# Patient Record
Sex: Female | Born: 1985 | Race: White | Hispanic: No | Marital: Married | Smoking: Former smoker
Health system: Southern US, Community
[De-identification: ages and names within clinical notes are randomized; demographics above are authoritative.]

## PROBLEM LIST (undated history)

## (undated) ENCOUNTER — Inpatient Hospital Stay: Payer: Self-pay

## (undated) DIAGNOSIS — Z87898 Personal history of other specified conditions: Secondary | ICD-10-CM

## (undated) DIAGNOSIS — K219 Gastro-esophageal reflux disease without esophagitis: Secondary | ICD-10-CM

## (undated) DIAGNOSIS — F32A Depression, unspecified: Secondary | ICD-10-CM

## (undated) DIAGNOSIS — I10 Essential (primary) hypertension: Secondary | ICD-10-CM

## (undated) DIAGNOSIS — O09299 Supervision of pregnancy with other poor reproductive or obstetric history, unspecified trimester: Secondary | ICD-10-CM

## (undated) DIAGNOSIS — R87619 Unspecified abnormal cytological findings in specimens from cervix uteri: Secondary | ICD-10-CM

## (undated) DIAGNOSIS — F419 Anxiety disorder, unspecified: Secondary | ICD-10-CM

## (undated) DIAGNOSIS — Z8751 Personal history of pre-term labor: Secondary | ICD-10-CM

## (undated) HISTORY — PX: COLPOSCOPY: SHX161

## (undated) HISTORY — DX: Depression, unspecified: F32.A

## (undated) HISTORY — DX: Gastro-esophageal reflux disease without esophagitis: K21.9

## (undated) HISTORY — DX: Anxiety disorder, unspecified: F41.9

## (undated) HISTORY — DX: Unspecified abnormal cytological findings in specimens from cervix uteri: R87.619

## (undated) HISTORY — PX: WISDOM TOOTH EXTRACTION: SHX21

---
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
## 2007-08-22 ENCOUNTER — Emergency Department: Payer: Self-pay | Admitting: Emergency Medicine

## 2007-12-05 ENCOUNTER — Emergency Department: Payer: Self-pay | Admitting: Emergency Medicine

## 2008-05-18 ENCOUNTER — Encounter: Payer: Self-pay | Admitting: Obstetrics and Gynecology

## 2008-06-21 ENCOUNTER — Observation Stay: Payer: Self-pay | Admitting: Obstetrics and Gynecology

## 2008-07-23 ENCOUNTER — Encounter: Payer: Self-pay | Admitting: Obstetrics and Gynecology

## 2008-07-30 ENCOUNTER — Encounter: Payer: Self-pay | Admitting: Obstetrics and Gynecology

## 2008-08-06 ENCOUNTER — Encounter: Payer: Self-pay | Admitting: Obstetrics and Gynecology

## 2008-08-13 ENCOUNTER — Encounter: Payer: Self-pay | Admitting: Maternal and Fetal Medicine

## 2008-08-13 ENCOUNTER — Emergency Department: Payer: Self-pay | Admitting: Emergency Medicine

## 2008-08-18 ENCOUNTER — Observation Stay: Payer: Self-pay | Admitting: Obstetrics and Gynecology

## 2008-08-19 DIAGNOSIS — O1490 Unspecified pre-eclampsia, unspecified trimester: Secondary | ICD-10-CM

## 2008-08-19 DIAGNOSIS — O1420 HELLP syndrome (HELLP), unspecified trimester: Secondary | ICD-10-CM

## 2008-08-27 ENCOUNTER — Encounter: Payer: Self-pay | Admitting: Obstetrics and Gynecology

## 2009-03-15 ENCOUNTER — Ambulatory Visit: Payer: Self-pay | Admitting: Internal Medicine

## 2010-07-25 ENCOUNTER — Emergency Department: Payer: Self-pay | Admitting: Unknown Physician Specialty

## 2010-10-30 ENCOUNTER — Emergency Department (HOSPITAL_COMMUNITY)
Admission: EM | Admit: 2010-10-30 | Discharge: 2010-10-30 | Disposition: A | Discharge status: Home / Self Care | Payer: Self-pay | Attending: Emergency Medicine | Admitting: Emergency Medicine

## 2010-10-30 ENCOUNTER — Encounter: Payer: Self-pay | Admitting: *Deleted

## 2010-10-30 DIAGNOSIS — N72 Inflammatory disease of cervix uteri: Secondary | ICD-10-CM | POA: Insufficient documentation

## 2010-10-30 DIAGNOSIS — Z87891 Personal history of nicotine dependence: Secondary | ICD-10-CM | POA: Insufficient documentation

## 2010-10-30 DIAGNOSIS — L0291 Cutaneous abscess, unspecified: Secondary | ICD-10-CM

## 2010-10-30 DIAGNOSIS — B9689 Other specified bacterial agents as the cause of diseases classified elsewhere: Secondary | ICD-10-CM | POA: Insufficient documentation

## 2010-10-30 DIAGNOSIS — N760 Acute vaginitis: Secondary | ICD-10-CM | POA: Insufficient documentation

## 2010-10-30 DIAGNOSIS — A499 Bacterial infection, unspecified: Secondary | ICD-10-CM | POA: Insufficient documentation

## 2010-10-30 LAB — POCT PREGNANCY, URINE: Preg Test, Ur: NEGATIVE

## 2010-10-30 LAB — URINALYSIS, ROUTINE W REFLEX MICROSCOPIC
Bilirubin Urine: NEGATIVE
Glucose, UA: NEGATIVE mg/dL
Hgb urine dipstick: NEGATIVE
Ketones, ur: NEGATIVE mg/dL
Leukocytes, UA: NEGATIVE
Nitrite: NEGATIVE
Protein, ur: NEGATIVE mg/dL
Specific Gravity, Urine: 1.01 (ref 1.005–1.030)
Urobilinogen, UA: 0.2 mg/dL (ref 0.0–1.0)
pH: 7.5 (ref 5.0–8.0)

## 2010-10-30 LAB — WET PREP, GENITAL: Yeast Wet Prep HPF POC: NONE SEEN

## 2010-10-30 MED ORDER — METRONIDAZOLE 500 MG PO TABS: 500.0000 mg | ORAL_TABLET | Freq: Two times a day (BID) | ORAL | Status: AC

## 2010-10-30 MED ORDER — IBUPROFEN 800 MG PO TABS
800.0000 mg | ORAL_TABLET | Freq: Once | ORAL | Status: AC
  Administered 2010-10-30: 800 mg via ORAL
  Filled 2010-10-30: qty 1

## 2010-10-30 MED ORDER — HYDROCODONE-ACETAMINOPHEN 5-325 MG PO TABS: ORAL_TABLET | ORAL | Status: AC

## 2010-10-30 MED ORDER — ACYCLOVIR 200 MG PO CAPS: ORAL_CAPSULE | ORAL | Status: DC

## 2010-10-30 NOTE — ED Provider Notes (Signed)
History     CSN: [id number] Arrival date & time: 10/30/2010  4:38 PM  Chief Complaint  Patient presents with  . Abscess   HPI Comments: Patient c/o small red "bump" to her upper vaginal area for one week.  States that she has had similar lesions in the past that resolved spontaneously.  Also reports vaginal discharge that thin and clear for several days. Denies any sexual activity for several months.  Also denies fever, urinary sx's, abd pain or vomiting  Patient is a 25 y.o. female presenting with abscess. The history is provided by the patient.  Abscess  This is a recurrent problem. The current episode started more than one week ago. The onset was gradual. The problem occurs continuously. The problem has been unchanged. The abscess is present on the genitalia. The problem is mild. The abscess is characterized by painfulness and redness. It is unknown what she was exposed to. Pertinent negatives include not sleeping less, not drinking less, no fever, no diarrhea, no vomiting, no sore throat and no cough. Associated symptoms comments: Vaginal discharge. There were no sick contacts. She has received no recent medical care.    History reviewed. No pertinent past medical history.  Past Surgical History  Procedure Date  . Cesarean section     History reviewed. No pertinent family history.  History  Substance Use Topics  . Smoking status: Former Smoker    Types: Cigarettes  . Smokeless tobacco: Not on file  . Alcohol Use: Yes     occasionally    OB History    Grav Para Term Preterm Abortions TAB SAB Ect Mult Living                  Review of Systems  Constitutional: Negative for fever and chills.  HENT: Negative for sore throat.   Respiratory: Negative for cough.   Gastrointestinal: Negative for vomiting and diarrhea.  Genitourinary: Positive for vaginal discharge and genital sores. Negative for dysuria, frequency, hematuria, vaginal bleeding, difficulty urinating, vaginal pain,  menstrual problem and pelvic pain.  Musculoskeletal: Negative.   Neurological: Negative for weakness and numbness.  Hematological: Does not bruise/bleed easily.  All other systems reviewed and are negative.    Physical Exam  BP 127/65  Pulse 91  Temp(Src) 98.7 F (37.1 C) (Oral)  Resp 16  Ht 5\' 4"  (1.626 m)  Wt 116 lb (52.617 kg)  BMI 19.91 kg/m2  SpO2 100%  Physical Exam  Nursing note and vitals reviewed. Constitutional: She is oriented to person, place, and time. She appears well-developed and well-nourished. No distress.  HENT:  Head: Normocephalic and atraumatic.  Mouth/Throat: Oropharynx is clear and moist.  Neck: Normal range of motion. Neck supple. No thyromegaly present.  Cardiovascular: Normal rate, regular rhythm and normal heart sounds.   Pulmonary/Chest: Effort normal and breath sounds normal.  Abdominal: Soft. She exhibits no distension and no mass. There is no tenderness. There is no rebound and no guarding.  Genitourinary: Uterus normal. There is no tenderness on the right labia. There is no tenderness on the left labia. Cervix exhibits discharge. Cervix exhibits no motion tenderness and no friability. Right adnexum displays no mass and no tenderness. Left adnexum displays no mass and no tenderness. No erythema or bleeding around the vagina. Vaginal discharge found.       Small ulceration to the clitoris, no other rashes, swelling or erythema seen on exam.  Musculoskeletal: She exhibits no edema and no tenderness.  Lymphadenopathy:    She  has no cervical adenopathy.  Neurological: She is alert and oriented to person, place, and time. No cranial nerve deficit. She exhibits normal muscle tone. Coordination normal.  Skin: Skin is warm and dry.    ED Course  Procedures  MDM  Bacterial vaginosis   7:17 PM patient is alert, NAD.  Vitals are stable, non-toxic appearing.  GC, chlaymdia and RPR culture pending.  Patient has small erythematous area to the clitoris  with mild edema.  No drainage.  I will treat with flagyl for the BV and zovirax for ulceration which may be herpatic although single ulcer without grouped vesicles..  Pt agrees to return here if sx's worsen.     Patient / Family / Caregiver understand and agree with initial ED impression and plan with expectations set for ED visit.    The patient appears reasonably screened and/or stabilized for discharge and I doubt any other medical condition or other [facility] requiring further screening, evaluation, or treatment in the ED at this time prior to discharge.   Results for orders placed during the hospital encounter of 10/30/10  WET PREP, GENITAL      Component Value Range   Yeast, Wet Prep NONE SEEN  NONE SEEN    Trich, Wet Prep NONE SEEN  NONE SEEN    Clue Cells, Wet Prep FEW (*) NONE SEEN    WBC, Wet Prep HPF POC FEW (*) NONE SEEN   URINALYSIS, ROUTINE W REFLEX MICROSCOPIC      Component Value Range   Color, Urine YELLOW  YELLOW    Appearance CLEAR  CLEAR    Specific Gravity, Urine 1.010  1.005 - 1.030    pH 7.5  5.0 - 8.0    Glucose, UA NEGATIVE  NEGATIVE (mg/dL)   Hgb urine dipstick NEGATIVE  NEGATIVE    Bilirubin Urine NEGATIVE  NEGATIVE    Ketones, ur NEGATIVE  NEGATIVE (mg/dL)   Protein, ur NEGATIVE  NEGATIVE (mg/dL)   Urobilinogen, UA 0.2  0.0 - 1.0 (mg/dL)   Nitrite NEGATIVE  NEGATIVE    Leukocytes, UA NEGATIVE  NEGATIVE   POCT PREGNANCY, URINE      Component Value Range   Preg Test, Ur NEGATIVE           Tammy L. Mayer, [place] 11/04/10 1459   Medical screening examination/treatment/procedure(s) were performed by non-physician practitioner and as supervising physician I was immediately available for consultation/collaboration.  Nicholes Stairs, MD 11/16/10 [phone]

## 2010-10-30 NOTE — ED Notes (Signed)
Pt states that she has had a knot at the top of her vagina x 1 week. Pt also c/o vaginal discharge today. States that it is clear.

## 2010-11-01 LAB — GC/CHLAMYDIA PROBE AMP, GENITAL: GC Probe Amp, Genital: NEGATIVE

## 2013-01-12 ENCOUNTER — Emergency Department (HOSPITAL_COMMUNITY): Payer: Self-pay

## 2013-01-12 ENCOUNTER — Encounter (HOSPITAL_COMMUNITY): Payer: Self-pay | Admitting: Emergency Medicine

## 2013-01-12 ENCOUNTER — Emergency Department (HOSPITAL_COMMUNITY): Payer: No Typology Code available for payment source

## 2013-01-12 ENCOUNTER — Emergency Department (HOSPITAL_COMMUNITY)
Admission: EM | Admit: 2013-01-12 | Discharge: 2013-01-12 | Disposition: A | Discharge status: Home / Self Care | Payer: Self-pay | Attending: Emergency Medicine | Admitting: Emergency Medicine

## 2013-01-12 DIAGNOSIS — Z79899 Other long term (current) drug therapy: Secondary | ICD-10-CM | POA: Insufficient documentation

## 2013-01-12 DIAGNOSIS — Z87891 Personal history of nicotine dependence: Secondary | ICD-10-CM | POA: Insufficient documentation

## 2013-01-12 DIAGNOSIS — S161XXA Strain of muscle, fascia and tendon at neck level, initial encounter: Secondary | ICD-10-CM

## 2013-01-12 DIAGNOSIS — S0003XA Contusion of scalp, initial encounter: Secondary | ICD-10-CM | POA: Insufficient documentation

## 2013-01-12 DIAGNOSIS — S025XXA Fracture of tooth (traumatic), initial encounter for closed fracture: Secondary | ICD-10-CM | POA: Insufficient documentation

## 2013-01-12 DIAGNOSIS — IMO0002 Reserved for concepts with insufficient information to code with codable children: Secondary | ICD-10-CM | POA: Insufficient documentation

## 2013-01-12 DIAGNOSIS — Y9389 Activity, other specified: Secondary | ICD-10-CM | POA: Insufficient documentation

## 2013-01-12 DIAGNOSIS — S0993XA Unspecified injury of face, initial encounter: Secondary | ICD-10-CM

## 2013-01-12 DIAGNOSIS — T07XXXA Unspecified multiple injuries, initial encounter: Secondary | ICD-10-CM | POA: Insufficient documentation

## 2013-01-12 DIAGNOSIS — S139XXA Sprain of joints and ligaments of unspecified parts of neck, initial encounter: Secondary | ICD-10-CM | POA: Insufficient documentation

## 2013-01-12 DIAGNOSIS — S79919A Unspecified injury of unspecified hip, initial encounter: Secondary | ICD-10-CM | POA: Insufficient documentation

## 2013-01-12 DIAGNOSIS — Y92410 Unspecified street and highway as the place of occurrence of the external cause: Secondary | ICD-10-CM | POA: Insufficient documentation

## 2013-01-12 MED ORDER — CYCLOBENZAPRINE HCL 5 MG PO TABS: 5.0000 mg | ORAL_TABLET | Freq: Three times a day (TID) | ORAL | Status: DC | PRN

## 2013-01-12 MED ORDER — OXYCODONE-ACETAMINOPHEN 5-325 MG PO TABS: 1.0000 | ORAL_TABLET | ORAL | Status: DC | PRN

## 2013-01-12 MED ORDER — OXYCODONE-ACETAMINOPHEN 5-325 MG PO TABS
1.0000 | ORAL_TABLET | Freq: Once | ORAL | Status: AC
  Administered 2013-01-12: 1 via ORAL
  Filled 2013-01-12: qty 1

## 2013-01-12 MED ORDER — NAPROXEN 500 MG PO TABS: 500.0000 mg | ORAL_TABLET | Freq: Two times a day (BID) | ORAL | Status: DC

## 2013-01-12 NOTE — ED Notes (Signed)
Hope NP at bedside  
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
## 2013-01-12 NOTE — ED Notes (Signed)
Hope NP and Dr. Bebe Shaggy at bedside,
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
## 2013-01-13 NOTE — ED Provider Notes (Signed)
Medical screening examination/treatment/procedure(s) were conducted as a shared visit with non-physician practitioner(s) and myself.  I personally evaluated the patient during the encounter.  EKG Interpretation   None        Pt well appearing, abdomen soft on my exam, she is feeling improved, and she is stable for d/c home  Joya Gaskins, MD 01/13/13 [phone]

## 2013-05-17 ENCOUNTER — Emergency Department (HOSPITAL_COMMUNITY)
Admission: EM | Admit: 2013-05-17 | Discharge: 2013-05-17 | Disposition: A | Discharge status: Home / Self Care | Payer: Self-pay | Attending: Emergency Medicine | Admitting: Emergency Medicine

## 2013-05-17 ENCOUNTER — Encounter (HOSPITAL_COMMUNITY): Payer: Self-pay | Admitting: Emergency Medicine

## 2013-05-17 DIAGNOSIS — Z87891 Personal history of nicotine dependence: Secondary | ICD-10-CM | POA: Insufficient documentation

## 2013-05-17 DIAGNOSIS — R059 Cough, unspecified: Secondary | ICD-10-CM | POA: Insufficient documentation

## 2013-05-17 DIAGNOSIS — R109 Unspecified abdominal pain: Secondary | ICD-10-CM | POA: Insufficient documentation

## 2013-05-17 DIAGNOSIS — R1110 Vomiting, unspecified: Secondary | ICD-10-CM

## 2013-05-17 DIAGNOSIS — R112 Nausea with vomiting, unspecified: Secondary | ICD-10-CM | POA: Insufficient documentation

## 2013-05-17 DIAGNOSIS — R05 Cough: Secondary | ICD-10-CM | POA: Insufficient documentation

## 2013-05-17 DIAGNOSIS — Z3202 Encounter for pregnancy test, result negative: Secondary | ICD-10-CM | POA: Insufficient documentation

## 2013-05-17 DIAGNOSIS — R197 Diarrhea, unspecified: Secondary | ICD-10-CM | POA: Insufficient documentation

## 2013-05-17 DIAGNOSIS — R51 Headache: Secondary | ICD-10-CM | POA: Insufficient documentation

## 2013-05-17 LAB — BASIC METABOLIC PANEL
BUN: 9 mg/dL (ref 6–23)
CALCIUM: 9.3 mg/dL (ref 8.4–10.5)
CHLORIDE: 103 meq/L (ref 96–112)
CO2: 30 mEq/L (ref 19–32)
CREATININE: 0.81 mg/dL (ref 0.50–1.10)
GFR calc non Af Amer: >90 mL/min (ref >90)
Glucose, Bld: 90 mg/dL (ref 70–99)
Potassium: 4.3 mEq/L (ref 3.7–5.3)
Sodium: 142 mEq/L (ref 137–147)

## 2013-05-17 LAB — CBC WITH DIFFERENTIAL/PLATELET
BASOS ABS: 0 10*3/uL (ref 0.0–0.1)
BASOS PCT: 0 % (ref 0–1)
EOS ABS: 0.1 10*3/uL (ref 0.0–0.7)
EOS PCT: 1 % (ref 0–5)
HEMATOCRIT: 40.8 % (ref 36.0–46.0)
HEMOGLOBIN: 13.9 g/dL (ref 12.0–15.0)
Lymphocytes Relative: 29 % (ref 12–46)
Lymphs Abs: 2 10*3/uL (ref 0.7–4.0)
MCH: 29.6 pg (ref 26.0–34.0)
MCHC: 34.1 g/dL (ref 30.0–36.0)
MCV: 86.8 fL (ref 78.0–100.0)
MONO ABS: 0.5 10*3/uL (ref 0.1–1.0)
MONOS PCT: 7 % (ref 3–12)
NEUTROS ABS: 4.4 10*3/uL (ref 1.7–7.7)
Neutrophils Relative %: 63 % (ref 43–77)
Platelets: 191 10*3/uL (ref 150–400)
RBC: 4.7 MIL/uL (ref 3.87–5.11)
RDW: 13.2 % (ref 11.5–15.5)
WBC: 7 10*3/uL (ref 4.0–10.5)

## 2013-05-17 LAB — POC URINE PREG, ED: PREG TEST UR: NEGATIVE

## 2013-05-17 MED ORDER — ONDANSETRON 4 MG PO TBDP
4.0000 mg | ORAL_TABLET | Freq: Once | ORAL | Status: AC
  Administered 2013-05-17: 4 mg via ORAL
  Filled 2013-05-17: qty 1

## 2013-05-17 MED ORDER — LOPERAMIDE HCL 2 MG PO CAPS: 2.0000 mg | ORAL_CAPSULE | Freq: Four times a day (QID) | ORAL | Status: DC | PRN

## 2013-05-17 MED ORDER — PROMETHAZINE HCL 25 MG PO TABS: 25.0000 mg | ORAL_TABLET | Freq: Four times a day (QID) | ORAL | Status: DC | PRN

## 2013-05-17 NOTE — ED Provider Notes (Signed)
CSN: [id number]     Arrival date & time 05/17/13  1408 History  This chart was scribed for Merryl Hacker, MD by Roxan Diesel, ED scribe.  This patient was seen in room APA10/APA10 and the patient's care was started at 3:15 PM.   Chief Complaint  Patient presents with  . Emesis    The history is provided by the patient. No language interpreter was used.    HPI Comments: Kaylee Hunt is a 28 y.o. female who presents to the Emergency Department complaining of nausea, vomiting and diarrhea that began this morning.  Pt states that everyone in her house has similar symptoms currently.  She also complains of 5/10 crampy lower abdominal pain that is nonradiating.  In addition she notes an occasional productive cough.  She denies blood in vomit or stool, fever, chills, CP, or dysuria.  She denies chance of pregnancy as she has an IUD.  Denies any chronic medical conditions or regular medication usage.  She denies medication allergies.   History reviewed. No pertinent past medical history.  Past Surgical History  Procedure Laterality Date  . Cesarean section      No family history on file.   History  Substance Use Topics  . Smoking status: Former Smoker    Types: Cigarettes  . Smokeless tobacco: Not on file  . Alcohol Use: Yes     Comment: occasionally    OB History   Grav Para Term Preterm Abortions TAB SAB Ect Mult Living                   Review of Systems  Constitutional: Negative for fever.  Respiratory: Negative for cough, chest tightness and shortness of breath.   Cardiovascular: Negative for chest pain.  Gastrointestinal: Positive for nausea, vomiting and abdominal pain.  Genitourinary: Negative for dysuria.  Musculoskeletal: Negative for back pain.  Skin: Negative for wound.  Neurological: Positive for headaches.  Psychiatric/Behavioral: Negative for confusion.  All other systems reviewed and are negative.      Allergies  Review of patient's allergies  indicates no known allergies.  Home Medications   Current Outpatient Rx  Name  Route  Sig  Dispense  Refill  . levonorgestrel (MIRENA) 20 MCG/24HR IUD   Intrauterine   1 each by Intrauterine route once.          BP 110/64  Pulse 62  Temp(Src) 97.7 F (36.5 C) (Oral)  Resp 20  Ht 5\' 4"  (1.626 m)  Wt 115 lb (52.164 kg)  BMI 19.73 kg/m2  SpO2 97%  Physical Exam  Nursing note and vitals reviewed. Constitutional: She is oriented to person, place, and time. She appears well-developed and well-nourished. No distress.  HENT:  Head: Normocephalic and atraumatic.  Mouth/Throat: Oropharynx is clear and moist.  Eyes: Pupils are equal, round, and reactive to light.  Neck: Neck supple.  Cardiovascular: Normal rate, regular rhythm and normal heart sounds.   No murmur heard. Pulmonary/Chest: Effort normal. No respiratory distress. She has no wheezes.  Abdominal: Soft. Bowel sounds are normal. There is no tenderness. There is no rebound and no guarding.  Musculoskeletal: She exhibits no edema.  Neurological: She is alert and oriented to person, place, and time.  Skin: Skin is warm and dry.  Psychiatric: She has a normal mood and affect.    ED Course  Procedures (including critical care time)  DIAGNOSTIC STUDIES: Oxygen Saturation is 97% on room air, normal by my interpretation.    COORDINATION OF CARE:  3:18 PM-Discussed treatment plan which includes labs with pt at bedside and pt agreed to plan.     Labs Review Labs Reviewed  CBC WITH DIFFERENTIAL  BASIC METABOLIC PANEL  POC URINE PREG, ED    Imaging Review No results found.   EKG Interpretation None      MDM   Final diagnoses:  Vomiting and diarrhea   Patient presents with vomiting and diarrhea. She is nontoxic on exam. Vital signs within normal limits. She has multiple sick contacts with similar symptoms. Will get basic labwork and urine pregnancy test. Discuss with patient oral Zofran and oral rehydration  given that she has no significant evidence of dehydration. Patient states understanding. She is requesting a work note.  Suspect viral etiology with reassuring workup.  After history, exam, and medical workup I feel the patient has been appropriately medically screened and is safe for discharge home. Pertinent diagnoses were discussed with the patient. Patient was given return precautions.   I personally performed the services described in this documentation, which was scribed in my presence. The recorded information has been reviewed and is accurate.   Merryl Hacker, MD 05/17/13 [phone]

## 2013-05-17 NOTE — ED Notes (Signed)
Pt c/o n/v/d, abd pain, headache that started this am, daughter was sick with the same symptoms,

## 2013-05-17 NOTE — Discharge Instructions (Signed)
Viral Gastroenteritis Viral gastroenteritis is also known as stomach flu. This condition affects the stomach and intestinal tract. It can cause sudden diarrhea and vomiting. The illness typically lasts 3 to 8 days. Most people develop an immune response that eventually gets rid of the virus. While this natural response develops, the virus can make you quite ill. CAUSES  Many different viruses can cause gastroenteritis, such as rotavirus or noroviruses. You can catch one of these viruses by consuming contaminated food or water. You may also catch a virus by sharing utensils or other personal items with an infected person or by touching a contaminated surface. SYMPTOMS  The most common symptoms are diarrhea and vomiting. These problems can cause a severe loss of body fluids (dehydration) and a body salt (electrolyte) imbalance. Other symptoms may include:  Fever.  Headache.  Fatigue.  Abdominal pain. DIAGNOSIS  Your caregiver can usually diagnose viral gastroenteritis based on your symptoms and a physical exam. A stool sample may also be taken to test for the presence of viruses or other infections. TREATMENT  This illness typically goes away on its own. Treatments are aimed at rehydration. The most serious cases of viral gastroenteritis involve vomiting so severely that you are not able to keep fluids down. In these cases, fluids must be given through an intravenous line (IV). HOME CARE INSTRUCTIONS   Drink enough fluids to keep your urine clear or pale yellow. Drink small amounts of fluids frequently and increase the amounts as tolerated.  Ask your caregiver for specific rehydration instructions.  Avoid:  Foods high in sugar.  Alcohol.  Carbonated drinks.  Tobacco.  Juice.  Caffeine drinks.  Extremely hot or cold fluids.  Fatty, greasy foods.  Too much intake of anything at one time.  Dairy products until 24 to 48 hours after diarrhea stops.  You may consume probiotics.  Probiotics are active cultures of beneficial bacteria. They may lessen the amount and number of diarrheal stools in adults. Probiotics can be found in yogurt with active cultures and in supplements.  Wash your hands well to avoid spreading the virus.  Only take over-the-counter or prescription medicines for pain, discomfort, or fever as directed by your caregiver. Do not give aspirin to children. Antidiarrheal medicines are not recommended.  Ask your caregiver if you should continue to take your regular prescribed and over-the-counter medicines.  Keep all follow-up appointments as directed by your caregiver. SEEK IMMEDIATE MEDICAL CARE IF:   You are unable to keep fluids down.  You do not urinate at least once every 6 to 8 hours.  You develop shortness of breath.  You notice blood in your stool or vomit. This may look like coffee grounds.  You have abdominal pain that increases or is concentrated in one small area (localized).  You have persistent vomiting or diarrhea.  You have a fever.  The patient is a child younger than 3 months, and he or she has a fever.  The patient is a child older than 3 months, and he or she has a fever and persistent symptoms.  The patient is a child older than 3 months, and he or she has a fever and symptoms suddenly get worse.  The patient is a baby, and he or she has no tears when crying. MAKE SURE YOU:   Understand these instructions.  Will watch your condition.  Will get help right away if you are not doing well or get worse. Document Released: 02/13/2005 Document Revised: 05/08/2011 Document Reviewed: 11/30/2010   [employer] Patient Information 2014 [employer]  

## 2013-09-18 DIAGNOSIS — F32A Depression, unspecified: Secondary | ICD-10-CM | POA: Insufficient documentation

## 2013-09-18 DIAGNOSIS — D249 Benign neoplasm of unspecified breast: Secondary | ICD-10-CM | POA: Insufficient documentation

## 2013-09-18 DIAGNOSIS — F329 Major depressive disorder, single episode, unspecified: Secondary | ICD-10-CM | POA: Insufficient documentation

## 2013-11-21 ENCOUNTER — Emergency Department: Payer: Self-pay | Admitting: Emergency Medicine

## 2013-11-21 LAB — CBC
HCT: 39 % (ref 35.0–47.0)
HGB: 12.8 g/dL (ref 12.0–16.0)
MCH: 28.8 pg (ref 26.0–34.0)
MCHC: 32.7 g/dL (ref 32.0–36.0)
MCV: 88 fL (ref 80–100)
Platelet: 189 10*3/uL (ref 150–440)
RBC: 4.42 10*6/uL (ref 3.80–5.20)
RDW: 12.4 % (ref 11.5–14.5)
WBC: 7.5 10*3/uL (ref 3.6–11.0)

## 2013-11-21 LAB — URINALYSIS, COMPLETE
BILIRUBIN, UR: NEGATIVE
Blood: NEGATIVE
GLUCOSE, UR: NEGATIVE mg/dL (ref 0–75)
Ketone: NEGATIVE
Nitrite: NEGATIVE
Ph: 5 (ref 4.5–8.0)
Protein: NEGATIVE
RBC,UR: 4 /HPF (ref 0–5)
Specific Gravity: 1.02 (ref 1.003–1.030)

## 2013-11-21 LAB — BASIC METABOLIC PANEL
Anion Gap: 5 — ABNORMAL LOW (ref 7–16)
BUN: 8 mg/dL (ref 7–18)
CHLORIDE: 109 mmol/L — AB (ref 98–107)
CREATININE: 0.68 mg/dL (ref 0.60–1.30)
Calcium, Total: 8.2 mg/dL — ABNORMAL LOW (ref 8.5–10.1)
Co2: 27 mmol/L (ref 21–32)
EGFR (African American): >60
EGFR (Non-African Amer.): >60
Glucose: 91 mg/dL (ref 65–99)
Osmolality: 279 (ref 275–301)
Potassium: 3.5 mmol/L (ref 3.5–5.1)
SODIUM: 141 mmol/L (ref 136–145)

## 2014-08-19 ENCOUNTER — Other Ambulatory Visit: Payer: Self-pay | Admitting: Advanced Practice Midwife

## 2014-08-19 DIAGNOSIS — Z91410 Personal history of adult physical and sexual abuse: Secondary | ICD-10-CM | POA: Insufficient documentation

## 2014-08-19 DIAGNOSIS — Z8751 Personal history of pre-term labor: Secondary | ICD-10-CM

## 2014-08-19 DIAGNOSIS — Z369 Encounter for antenatal screening, unspecified: Secondary | ICD-10-CM

## 2014-08-19 HISTORY — DX: Personal history of pre-term labor: Z87.51

## 2014-08-19 LAB — HM PAP SMEAR: HM Pap smear: NEGATIVE

## 2014-08-20 LAB — OB RESULTS CONSOLE RPR: RPR: NONREACTIVE

## 2014-08-20 LAB — OB RESULTS CONSOLE GC/CHLAMYDIA
Chlamydia: NEGATIVE
Gonorrhea: NEGATIVE

## 2014-08-20 LAB — OB RESULTS CONSOLE HEPATITIS B SURFACE ANTIGEN: HEP B S AG: NEGATIVE

## 2014-08-20 LAB — OB RESULTS CONSOLE ABO/RH: RH TYPE: POSITIVE

## 2014-08-20 LAB — OB RESULTS CONSOLE HIV ANTIBODY (ROUTINE TESTING): HIV: NONREACTIVE

## 2014-08-20 LAB — OB RESULTS CONSOLE RUBELLA ANTIBODY, IGM: Rubella: IMMUNE

## 2014-08-20 LAB — OB RESULTS CONSOLE VARICELLA ZOSTER ANTIBODY, IGG: Varicella: IMMUNE

## 2014-08-20 LAB — OB RESULTS CONSOLE ANTIBODY SCREEN: ANTIBODY SCREEN: NEGATIVE

## 2014-09-10 ENCOUNTER — Ambulatory Visit
Admission: RE | Admit: 2014-09-10 | Discharge: 2014-09-10 | Disposition: A | Discharge status: Home / Self Care | Payer: Medicaid Other | Source: Ambulatory Visit | Attending: Advanced Practice Midwife | Admitting: Advanced Practice Midwife

## 2014-09-10 ENCOUNTER — Ambulatory Visit (HOSPITAL_BASED_OUTPATIENT_CLINIC_OR_DEPARTMENT_OTHER)
Admission: RE | Admit: 2014-09-10 | Discharge: 2014-09-10 | Disposition: A | Discharge status: Home / Self Care | Payer: Medicaid Other | Source: Ambulatory Visit | Attending: Obstetrics & Gynecology | Admitting: Obstetrics & Gynecology

## 2014-09-10 ENCOUNTER — Ambulatory Visit
Admission: RE | Admit: 2014-09-10 | Discharge: 2014-09-10 | Disposition: A | Discharge status: Home / Self Care | Payer: Medicaid Other | Source: Ambulatory Visit | Attending: Obstetrics & Gynecology | Admitting: Obstetrics & Gynecology

## 2014-09-10 DIAGNOSIS — Z3A12 12 weeks gestation of pregnancy: Secondary | ICD-10-CM | POA: Insufficient documentation

## 2014-09-10 DIAGNOSIS — Z369 Encounter for antenatal screening, unspecified: Secondary | ICD-10-CM

## 2014-09-10 DIAGNOSIS — O09291 Supervision of pregnancy with other poor reproductive or obstetric history, first trimester: Secondary | ICD-10-CM

## 2014-09-10 DIAGNOSIS — O09299 Supervision of pregnancy with other poor reproductive or obstetric history, unspecified trimester: Secondary | ICD-10-CM

## 2014-09-10 DIAGNOSIS — Z87898 Personal history of other specified conditions: Secondary | ICD-10-CM | POA: Diagnosis present

## 2014-09-10 DIAGNOSIS — Z36 Encounter for antenatal screening of mother: Secondary | ICD-10-CM | POA: Insufficient documentation

## 2014-09-10 HISTORY — DX: Essential (primary) hypertension: I10

## 2014-09-10 HISTORY — DX: Personal history of other specified conditions: Z87.898

## 2014-09-10 HISTORY — DX: Supervision of pregnancy with other poor reproductive or obstetric history, unspecified trimester: O09.299

## 2014-09-10 LAB — US OB COMP LESS 14 WKS

## 2014-09-10 NOTE — Progress Notes (Signed)
Kaylee Hunt Consultation:  Loan is a 29 year-old G4 P0121 at 12 5/7 weeks who presents for MFM consultation due to history of prior pregnancy complicated by early fetal growth restriction and HELLP syndrome at 31 weeks requiring delivery.  In 2010, Kaylee Hunt was diagnosed with fetal growth restriction at the 5% at 27 weeks. She was followed closely and then at 31 weeks she developed severe preeclampsia and was transferred to [facility]. At 31 4/7 weeks she underwent a low-transverse cesarean delivery in setting of HELLP syndrome (platelet count 71, AST 66, ALT 57) and breech presentation. Her post-operative course was uncomplicated.  She had antiphospholipid antibody testing and an inherited thrombophilia evaluation in the MFM Clinic in August of 2010, which was negative.  She has no complaints.   PMH: Denies PSH: low-transverse cesarean delivery in June 2010, [place].  Wisdom teeth extraction. Breast biopsy PGynH: Remote history of chlamydia infection, treated. Denies history of abnormal paps PObH: G4 P0121.  2 first trimester SABs, no D&Cs for either. 1 preterm delivery as in HPI Meds: Prenatal vitamins All: NKDA SH: Quit smoking. Works as a Educational psychologist at [employer].  Daughter is doing well ROS: No complaints  Exam:   BP 115/62, wt 138 pounds  [place]: See [place] report. Single live IUP at 12 5/7 weeks. NT 1.3 mm. Normal fluid.  Thrombophilia panel ([facility], 09/30/08):  Lupus anticoagulant negative, Antibeta 2 glycoprotein antibody neg, anticardiolipin antibody neg, Factor V Leiden neg, Prothrombin gene mutation neg, Protein S level normal, Protein C level Normal, ATIII level normal  Prenatal labs (08/20/14, [facility]):  Blood type A positive, antibody screen negative, RPR non-reactive, Hep B negative, GC/Chl neg, Urine tox Positive THC, Hct 37.5, MCV 85, Plt 245, uric acid 2.4, Cr 0.47, AST 11  Assessment and Recommendations. 29 year-old G4 P0121 at 6 5/7  weeks with prior pregnancy complicated by early fetal growth restriction and then HELLP syndrome at 31 weeks requiring delivery.  That child is doing well. Her antiphospholipid antibody panel and inherited thrombophilia panels following that delivery were negative.  Kaylee Hunt is at risk for preeclampsia. We discussed the use of low-dose aspirin to decrease her risk of recurrent preeclampsia.  -Start daily baby aspirin at around 13-14 weeks (81 mg daily) -First trimester screen sent today -Encourage cessation of THC use given potential pregnancy complications associated with its use -Detailed ultrasound in five weeks was scheduled -Growth scan at 28 weeks. We will be happy to perform -Screen for preeclampsia at prenatal visits  Apryll Hinkle, [place] A, MD

## 2014-09-10 NOTE — Progress Notes (Addendum)
Referring physician:  [facility] Department Length of Consultation: 40 minutes   Kaylee Hunt  was referred to [facility] for genetic counseling to review prenatal screening and testing options.  This note summarizes the information we discussed.    First trimester screening, which can include nuchal translucency ultrasound screen and/or first trimester maternal serum marker screening.  The nuchal translucency has approximately an 80% detection rate for Down syndrome and can be positive for other chromosome abnormalities as well as congenital heart defects.  When combined with a maternal serum marker screening, the detection rate is up to 90% for Down syndrome and up to 97% for trisomy 18.     Maternal serum marker screening, a blood test that measures pregnancy proteins, can provide risk assessments for Down syndrome, trisomy 18, and open neural tube defects (spina bifida, anencephaly). Because it does not directly examine the fetus, it cannot positively diagnose or rule out these problems.  Targeted ultrasound uses high frequency sound waves to create an image of the developing fetus.  An ultrasound is often recommended as a routine means of evaluating the pregnancy.  It is also used to screen for fetal anatomy problems (for example, a heart defect) that might be suggestive of a chromosomal or other abnormality.   Should these screening tests indicate an increased concern, then the following diagnostic options would be offered:  The chorionic villus sampling procedure is available for first trimester chromosome analysis.  This involves the withdrawal of a small amount of chorionic villi (tissue from the developing placenta).  Risk of pregnancy loss is estimated to be approximately 1 in 200 to 1 in 100 (0.5 to 1%).  There is approximately a 1% (1 in 100) chance that the CVS chromosome results will be unclear.  Chorionic villi cannot be tested for neural tube defects.      Amniocentesis involves the removal of a small amount of amniotic fluid from the sac surrounding the fetus with the use of a thin needle inserted through the maternal abdomen and uterus.  Ultrasound guidance is used throughout the procedure.  Fetal cells from amniotic fluid are directly evaluated and > 99.5% of chromosome problems and > 98% of open neural tube defects can be detected. This procedure is generally performed after the 15th week of pregnancy.  The main risks to this procedure include complications leading to miscarriage in less than 1 in 200 cases (0.5%).  As another option for information if the pregnancy is suspected to be an an increased chance for certain chromosome conditions, we also reviewed the availability of cell free fetal DNA testing from maternal blood to determine whether or not the baby may have either Down syndrome, trisomy 77, or trisomy 58.  This test utilizes a maternal blood sample and DNA sequencing technology to isolate circulating cell free fetal DNA from maternal plasma.  The fetal DNA can then be analyzed for DNA sequences that are derived from the three most common chromosomes involved in aneuploidy, chromosomes 13, 18, and 21.  If the overall amount of DNA is greater than the expected level for any of these chromosomes, aneuploidy is suspected.  This testing is commercially available, and is able to provide another means of determining the chance for one of these common chromosome conditions, without requiring an invasive procedure and traditional karyotype analysis.  While this is new technology, the testing does show great promise, and we offered it as an option.  We discussed this option with her  in detail.  We explained that while we do not consider it a replacement for invasive testing and karyotype analysis, a negative result from this testing would be reassuring, though not a guarantee of a normal chromosome complement for the baby.  An abnormal result is certainly  suggestive of an abnormal chromosome complement, though we would still recommend CVS or amniocentesis to confirm any findings from this testing.  Cystic Fibrosis screening was also discussed with the patient. Cystic fibrosis (CF) is one of the most common genetic conditions in persons of Caucasian ancestry.  This condition occurs in approximately 1 in 2,500 Caucasian persons and results in thickened secretions in the lungs, digestive, and reproductive systems.  For a baby to be at risk for having CF, both of the parents must be carriers for this condition.  Approximately 1 in 91 Caucasian persons is a carrier for CF.  Current carrier testing looks for the most common mutations in the gene for CF and can detect approximately 90% of carriers in the Caucasian population.  This means that the carrier screening can greatly reduce, but cannot eliminate, the chance for an individual to have a child with CF.  If an individual is found to be a carrier for CF, then carrier testing would be available for the partner. As part of [place] newborn screening profile, all babies born in the state of [place] will have a two-tier screening process.  Specimens are first tested to determine the concentration of immunoreactive trypsinogen (IRT).  The top 5% of specimens with the highest IRT values then undergo DNA testing using a panel of over 40 common CF mutations. The father of the pregnancy is of [place] ancestry.  The patient declined CF carrier testing at this visit.  We obtained a detailed family history and pregnancy history.  In the family history, Kaylee Hunt reported that her mother passed away from breast cancer which was initially diagnosed at 29 years old. The only other reported relative with cancer was her maternal great grandmother who also had breast cancer, but it occurred late in life.  We reviewed that the majority of cancer occurs by chance.  When there are multiple family members with the same  type of cancer, particularly at young ages, there may be a genetic predisposition.  We encouraged Kaylee Hunt to remain in contact with her doctor about this history and be diligent about self exams and mammography as recommended by her doctor.  If she is concerned about the family history or any other diagnoses are made in the family, we are happy to provide the contact information for a cancer genetic counseling clinic in the area.  The remainder of the family history was reported to be unremarkable for birth defects, mental retardation, recurrent pregnancy loss or known chromosome abnormalities.  Kaylee Hunt stated that this is her second pregnancy.  She has a 72 year old daughter from a previous relationship who is in good health.  That pregnancy was complicated by IUGR, preeclampsia and delivery at [redacted] weeks gestation.  See full MFM consultation note for those details and recommendations.  She reported no complications thus far in the pregnancy.  Prior to learning that she was pregnant, she smoked marijuana.  The use of marijuana in pregnancy is known to be associated with low birth weight and premature delivery.  We therefore suggested the patient avoid smoking marijuana during this time.  After consideration of the options, Kaylee Hunt elected to proceed with first trimester screening.  An ultrasound was performed at the time of the visit.  The gestational age was consistent with  12 weeks.  Fetal anatomy could not be assessed due to early gestational age.  Please refer to the ultrasound report for details of that study.  Kaylee Hunt was encouraged to call with questions or concerns.  We can be contacted at [phone].    Wilburt Finlay, MS, CGC  Tris Howell, [place] A, MD

## 2014-09-14 ENCOUNTER — Telehealth: Payer: Self-pay | Admitting: Obstetrics and Gynecology

## 2014-09-14 NOTE — Telephone Encounter (Signed)
  Ms. Tamala Julian elected to undergo First Trimester screening on September 10, 2014.  To review, first trimester screening, includes nuchal translucency ultrasound screen and/or first trimester maternal serum marker screening.  The nuchal translucency has approximately an 80% detection rate for Down syndrome and can be positive for other chromosome abnormalities as well as heart defects.  When combined with a maternal serum marker screening, the detection rate is up to 90% for Down syndrome and up to 97% for trisomy 13 and 18.     The results of the First Trimester Nuchal Translucency and Biochemical Screening were within normal range.  The risk for Down syndrome is now estimated to be less than 1 in 10,000.  The risk for Trisomy 13/18 is also estimated to be less than 1 in 10,000.  Should more definitive information be desired, we would offer amniocentesis.  Because we do not yet know the effectiveness of combined first and second trimester screening, we do not recommend a maternal serum screen to assess the chance for chromosome conditions.  However, if screening for neural tube defects is desired, maternal serum screening for AFP only can be performed between 15 and [redacted] weeks gestation.

## 2014-09-28 NOTE — Addendum Note (Signed)
Encounter addended by: [place] Amberleigh Gerken, MD on: 09/28/2014 11:02 AM<BR>     Documentation filed: Notes Section

## 2014-10-08 ENCOUNTER — Other Ambulatory Visit: Payer: Self-pay | Admitting: Obstetrics & Gynecology

## 2014-10-08 DIAGNOSIS — O09291 Supervision of pregnancy with other poor reproductive or obstetric history, first trimester: Secondary | ICD-10-CM

## 2014-10-08 DIAGNOSIS — Z87898 Personal history of other specified conditions: Secondary | ICD-10-CM

## 2014-10-12 ENCOUNTER — Ambulatory Visit
Admission: RE | Admit: 2014-10-12 | Discharge: 2014-10-12 | Disposition: A | Discharge status: Home / Self Care | Payer: Medicaid Other | Source: Ambulatory Visit | Attending: Maternal & Fetal Medicine | Admitting: Maternal & Fetal Medicine

## 2014-10-12 VITALS — BP 113/58 | HR 71 | Temp 98.6°F | Wt 147.0 lb

## 2014-10-12 DIAGNOSIS — O09299 Supervision of pregnancy with other poor reproductive or obstetric history, unspecified trimester: Secondary | ICD-10-CM | POA: Diagnosis not present

## 2014-10-12 DIAGNOSIS — O09291 Supervision of pregnancy with other poor reproductive or obstetric history, first trimester: Secondary | ICD-10-CM

## 2014-10-12 DIAGNOSIS — O09292 Supervision of pregnancy with other poor reproductive or obstetric history, second trimester: Secondary | ICD-10-CM

## 2014-10-12 DIAGNOSIS — Z3A17 17 weeks gestation of pregnancy: Secondary | ICD-10-CM | POA: Diagnosis not present

## 2014-10-12 DIAGNOSIS — Z87898 Personal history of other specified conditions: Secondary | ICD-10-CM

## 2014-10-12 LAB — US OB DETAIL + 14 WK

## 2014-12-25 LAB — HM HIV SCREENING LAB: HM HIV Screening: NEGATIVE

## 2014-12-28 ENCOUNTER — Other Ambulatory Visit: Payer: Self-pay | Admitting: Obstetrics & Gynecology

## 2014-12-28 ENCOUNTER — Ambulatory Visit
Admission: RE | Admit: 2014-12-28 | Discharge: 2014-12-28 | Disposition: A | Discharge status: Home / Self Care | Payer: Medicaid Other | Source: Ambulatory Visit | Attending: Obstetrics and Gynecology | Admitting: Obstetrics and Gynecology

## 2014-12-28 VITALS — BP 110/60 | HR 92 | Temp 98.4°F | Wt 161.0 lb

## 2014-12-28 DIAGNOSIS — O09291 Supervision of pregnancy with other poor reproductive or obstetric history, first trimester: Secondary | ICD-10-CM | POA: Insufficient documentation

## 2014-12-28 DIAGNOSIS — O09293 Supervision of pregnancy with other poor reproductive or obstetric history, third trimester: Secondary | ICD-10-CM

## 2014-12-28 DIAGNOSIS — O1490 Unspecified pre-eclampsia, unspecified trimester: Secondary | ICD-10-CM | POA: Diagnosis not present

## 2014-12-28 DIAGNOSIS — Z87898 Personal history of other specified conditions: Secondary | ICD-10-CM

## 2014-12-28 DIAGNOSIS — Z3A01 Less than 8 weeks gestation of pregnancy: Secondary | ICD-10-CM | POA: Diagnosis not present

## 2014-12-28 DIAGNOSIS — Z36 Encounter for antenatal screening of mother: Secondary | ICD-10-CM | POA: Diagnosis present

## 2015-01-25 ENCOUNTER — Ambulatory Visit
Admission: RE | Admit: 2015-01-25 | Discharge: 2015-01-25 | Disposition: A | Discharge status: Home / Self Care | Payer: Medicaid Other | Source: Ambulatory Visit | Attending: Obstetrics & Gynecology | Admitting: Obstetrics & Gynecology

## 2015-01-25 DIAGNOSIS — O09293 Supervision of pregnancy with other poor reproductive or obstetric history, third trimester: Secondary | ICD-10-CM

## 2015-01-25 DIAGNOSIS — Z3A32 32 weeks gestation of pregnancy: Secondary | ICD-10-CM | POA: Diagnosis not present

## 2015-01-25 DIAGNOSIS — Z87898 Personal history of other specified conditions: Secondary | ICD-10-CM | POA: Insufficient documentation

## 2015-01-25 DIAGNOSIS — Z8759 Personal history of other complications of pregnancy, childbirth and the puerperium: Secondary | ICD-10-CM

## 2015-02-22 ENCOUNTER — Inpatient Hospital Stay
Admission: EM | Admit: 2015-02-22 | Discharge: 2015-02-22 | Disposition: A | Discharge status: Home / Self Care | Payer: Medicaid Other | Attending: Obstetrics and Gynecology | Admitting: Obstetrics and Gynecology

## 2015-02-22 ENCOUNTER — Encounter: Payer: Self-pay | Admitting: *Deleted

## 2015-02-22 DIAGNOSIS — Z3A36 36 weeks gestation of pregnancy: Secondary | ICD-10-CM | POA: Insufficient documentation

## 2015-02-22 DIAGNOSIS — O26893 Other specified pregnancy related conditions, third trimester: Secondary | ICD-10-CM | POA: Diagnosis not present

## 2015-02-22 DIAGNOSIS — Z87898 Personal history of other specified conditions: Secondary | ICD-10-CM

## 2015-02-22 DIAGNOSIS — R030 Elevated blood-pressure reading, without diagnosis of hypertension: Secondary | ICD-10-CM | POA: Diagnosis present

## 2015-02-22 DIAGNOSIS — O09293 Supervision of pregnancy with other poor reproductive or obstetric history, third trimester: Secondary | ICD-10-CM

## 2015-02-22 LAB — PROTEIN / CREATININE RATIO, URINE
CREATININE, URINE: 139 mg/dL
Protein Creatinine Ratio: 0.18 mg/mg{Cre} — ABNORMAL HIGH (ref 0.00–0.15)
Total Protein, Urine: 25 mg/dL

## 2015-02-22 LAB — URIC ACID: URIC ACID, SERUM: 4.4 mg/dL (ref 2.3–6.6)

## 2015-02-22 LAB — COMPREHENSIVE METABOLIC PANEL
ALK PHOS: 186 U/L — AB (ref 38–126)
ALT: 8 U/L — AB (ref 14–54)
AST: 13 U/L — ABNORMAL LOW (ref 15–41)
Albumin: 3.1 g/dL — ABNORMAL LOW (ref 3.5–5.0)
Anion gap: 7 (ref 5–15)
BUN: 8 mg/dL (ref 6–20)
CALCIUM: 8.8 mg/dL — AB (ref 8.9–10.3)
CO2: 23 mmol/L (ref 22–32)
CREATININE: 0.37 mg/dL — AB (ref 0.44–1.00)
Chloride: 104 mmol/L (ref 101–111)
GFR calc non Af Amer: >60 mL/min (ref >60)
Glucose, Bld: 73 mg/dL (ref 65–99)
Potassium: 3.7 mmol/L (ref 3.5–5.1)
Sodium: 134 mmol/L — ABNORMAL LOW (ref 135–145)
Total Bilirubin: 1.3 mg/dL — ABNORMAL HIGH (ref 0.3–1.2)
Total Protein: 6.7 g/dL (ref 6.5–8.1)

## 2015-02-22 LAB — CBC WITH DIFFERENTIAL/PLATELET
Basophils Absolute: 0.1 10*3/uL (ref 0–0.1)
Basophils Relative: 1 %
Eosinophils Absolute: 0 10*3/uL (ref 0–0.7)
Eosinophils Relative: 0 %
HCT: 31.8 % — ABNORMAL LOW (ref 35.0–47.0)
HEMOGLOBIN: 10.5 g/dL — AB (ref 12.0–16.0)
LYMPHS PCT: 10 %
Lymphs Abs: 1.3 10*3/uL (ref 1.0–3.6)
MCH: 25.9 pg — AB (ref 26.0–34.0)
MCHC: 33.1 g/dL (ref 32.0–36.0)
MCV: 78.4 fL — ABNORMAL LOW (ref 80.0–100.0)
MONOS PCT: 5 %
Monocytes Absolute: 0.7 10*3/uL (ref 0.2–0.9)
Neutro Abs: 11.6 10*3/uL — ABNORMAL HIGH (ref 1.4–6.5)
Neutrophils Relative %: 84 %
Platelets: 186 10*3/uL (ref 150–440)
RBC: 4.06 MIL/uL (ref 3.80–5.20)
RDW: 14.2 % (ref 11.5–14.5)
WBC: 13.6 10*3/uL — AB (ref 3.6–11.0)

## 2015-02-22 LAB — URINALYSIS COMPLETE WITH MICROSCOPIC (ARMC ONLY)
BACTERIA UA: NONE SEEN
Bilirubin Urine: NEGATIVE
Glucose, UA: NEGATIVE mg/dL
Hgb urine dipstick: NEGATIVE
Nitrite: NEGATIVE
PH: 6 (ref 5.0–8.0)
Protein, ur: 30 mg/dL — AB
Specific Gravity, Urine: 1.016 (ref 1.005–1.030)

## 2015-02-22 NOTE — OB Triage Provider Note (Signed)
History     CSN: [id number]  Arrival date and time: 02/22/15 1542   None     Chief Complaint  Patient presents with  . Hypertension   Hypertension Pertinent negatives include no blurred vision, chest pain, headaches, palpitations or shortness of breath.  Kaylee Hunt is a 29 yo G4P1 at 36+2 weeks by LMP 06/13/14 with an EDD of 03/20/15.  She is a previous C/S and is receiving care at [facility].  She presents today with c/o elevated BPs and increased swelling in her in feet.  [facility] and her BP was 133/88 and she was told to go to the hospital if her BPs were elevated, but she was not given parameters.  She is planning a repeat C/S with TJS on 03/15/15.  She denies HA, epigastric pain, and only has occasional dark floaters, but nothing worse than before.  She has a history of severe preeclampsia with HELLP syndrome at 31 weeks and delivered by cesarean delivery at [facility]   OB History    Gravida Para Term Preterm AB TAB SAB Ectopic Multiple Living   _0 Past Medical History  Diagnosis Date  . Hypertension     Past Surgical History  Procedure Laterality Date  . Cesarean section      History reviewed. No pertinent family history.  Social History  Substance Use Topics  . Smoking status: Former Smoker    Types: Cigarettes    Quit date: 09/09/2008  . Smokeless tobacco: Never Used  . Alcohol Use: No     Comment: denies    Allergies: No Known Allergies  Prescriptions prior to admission  Medication Sig Dispense Refill Last Dose  . aspirin 81 MG tablet Take 81 mg by mouth daily.   02/21/2015 at Unknown time  . Prenatal Vit-Fe Fumarate-FA (PRENATAL MULTIVITAMIN) TABS tablet Take 1 tablet by mouth daily at 12 noon. Reported on 02/22/2015   Not Taking at Unknown time  . promethazine (PHENERGAN) 25 MG tablet Take 1 tablet (25 mg total) by mouth every 6 (six) hours as needed for nausea or vomiting. (Patient not taking: Reported on 09/10/2014) 30 tablet 0 Not  Taking at Unknown time    Review of Systems  Constitutional: Negative for fever and chills.  Eyes: Positive for photophobia. Negative for blurred vision and double vision.       Occasional dark floaters - but nothing worse than she has had   Respiratory: Negative for cough, hemoptysis, shortness of breath and wheezing.   Cardiovascular: Negative for chest pain and palpitations.  Gastrointestinal: Negative for heartburn, nausea, vomiting and abdominal pain.  Genitourinary: Negative.   Musculoskeletal: Negative.   Skin: Negative.   Neurological: Negative.  Negative for headaches.  Endo/Heme/Allergies: Negative.   Psychiatric/Behavioral: Negative.    Physical Exam   Blood pressure 131/80, pulse 98, temperature 98.3 F (36.8 C), temperature source Oral, resp. rate 18, height 5' 4" (1.626 m), weight 74.844 kg (165 lb), last menstrual period 06/13/2014.  Physical Exam  Constitutional: She is oriented to person, place, and time. She appears well-developed and well-nourished.  Eyes: Pupils are equal, round, and reactive to light.  Cardiovascular: Normal rate and regular rhythm.   Respiratory: Breath sounds normal.  GI: Soft. Bowel sounds are normal.  Musculoskeletal: She exhibits edema.  +1 pitting edema   Neurological: She is alert and oriented to person, place, and time. She displays normal reflexes.  +2  DTRs  Skin: Skin is warm and dry.  Baseline: 135 bpm / Moderate variability / +accels / no decels TOCO: occ UI  Results for Kaylee, Hunt (MRN [id number]) as of 02/22/2015 18:24  Ref. Range 02/22/2015 16:28 02/22/2015 16:44  Sodium Latest Ref Range: 135-145 mmol/L  134 (L)  Potassium Latest Ref Range: 3.5-5.1 mmol/L  3.7  Chloride Latest Ref Range: 101-111 mmol/L  104  CO2 Latest Ref Range: 22-32 mmol/L  23  BUN Latest Ref Range: 6-20 mg/dL  8  Creatinine Latest Ref Range: 0.44-1.00 mg/dL  0.37 (L)  Calcium Latest Ref Range: 8.9-10.3 mg/dL  8.8 (L)  EGFR (Non-African Amer.)  Latest Ref Range: >60 mL/min  >60  EGFR (African American) Latest Ref Range: >60 mL/min  >60  Glucose Latest Ref Range: 65-99 mg/dL  73  Anion gap Latest Ref Range: 5-15   7  Alkaline Phosphatase Latest Ref Range: 38-126 U/L  186 (H)  Albumin Latest Ref Range: 3.5-5.0 g/dL  3.1 (L)  Uric Acid, Serum Latest Ref Range: 2.3-6.6 mg/dL  4.4  AST Latest Ref Range: 15-41 U/L  13 (L)  ALT Latest Ref Range: 14-54 U/L  8 (L)  Total Protein Latest Ref Range: 6.5-8.1 g/dL  6.7  Total Bilirubin Latest Ref Range: 0.3-1.2 mg/dL  1.3 (H)  WBC Latest Ref Range: 3.6-11.0 K/uL  13.6 (H)  RBC Latest Ref Range: 3.80-5.20 MIL/uL  4.06  Hemoglobin Latest Ref Range: 12.0-16.0 g/dL  10.5 (L)  HCT Latest Ref Range: 35.0-47.0 %  31.8 (L)  MCV Latest Ref Range: 80.0-100.0 fL  78.4 (L)  MCH Latest Ref Range: 26.0-34.0 pg  25.9 (L)  MCHC Latest Ref Range: 32.0-36.0 g/dL  33.1  RDW Latest Ref Range: 11.5-14.5 %  14.2  Platelets Latest Ref Range: 150-440 K/uL  186  Neutrophils Latest Units: %  84  Lymphocytes Latest Units: %  10  Monocytes Relative Latest Units: %  5  Eosinophil Latest Units: %  0  Basophil Latest Units: %  1  NEUT# Latest Ref Range: 1.4-6.5 K/uL  11.6 (H)  Lymphocyte # Latest Ref Range: 1.0-3.6 K/uL  1.3  Monocyte # Latest Ref Range: 0.2-0.9 K/uL  0.7  Eosinophils Absolute Latest Ref Range: 0-0.7 K/uL  0.0  Basophils Absolute Latest Ref Range: 0-0.1 K/uL  0.1  Appearance Latest Ref Range: CLEAR  CLOUDY (A)   Bacteria, UA Latest Ref Range: NONE SEEN  NONE SEEN   Bilirubin Urine Latest Ref Range: NEGATIVE  NEGATIVE   Budding Yeast Unknown PRESENT   Color, Urine Latest Ref Range: YELLOW  AMBER (A)   Glucose Latest Ref Range: NEGATIVE mg/dL NEGATIVE   Hgb urine dipstick Latest Ref Range: NEGATIVE  NEGATIVE   Ketones, ur Latest Ref Range: NEGATIVE mg/dL 2+ (A)   Leukocytes, UA Latest Ref Range: NEGATIVE  3+ (A)   Mucous Unknown PRESENT   Nitrite Latest Ref Range: NEGATIVE  NEGATIVE   pH Latest  Ref Range: 5.0-8.0  6.0   Protein Latest Ref Range: NEGATIVE mg/dL 30 (A)   RBC / HPF Latest Ref Range: 0-5 RBC/hpf 0-5   Specific Gravity, Urine Latest Ref Range: 1.005-1.030  1.016   Squamous Epithelial / LPF Latest Ref Range: NONE SEEN  TOO NUMEROUS TO C... (A)   WBC, UA Latest Ref Range: 0-5 WBC/hpf TOO NUMEROUS TO C...   Total Protein, Urine Latest Units: mg/dL 25   Protein Creatinine Ratio Latest Ref Range: 0.00-0.15 mg/mgCre 0.18 (H)   Creatinine, Urine Latest Units: mg/dL 139  Procedures    Assessment and Plan  IUP at 36+2 weeks Elevated BP's - r/o pre-eclampsia  PIH labs Fetal monitoring   Darliss Cheney 02/22/2015, 4:41 PM   Update at 1830: Filed Vitals:   02/22/15 1615 02/22/15 1630  BP: 128/64 114/64  Pulse: 98 89  Temp:    Resp:     IUP at 36+2 weeks Category 1 FHR tracing  BP's stable  Labs stable  D/C home Sent home with 24 hour urine FKC's daily Preterm labor precautions and warning s/s reviewed F/U with [facility] on Thursday 12/29 Planning repeat LTCS with Dr. Ouida Sills on 03/15/15 - has already had her appt with him  Dr. Leafy Ro aware and updated and agrees  Lars Pinks, CNM

## 2015-02-22 NOTE — Discharge Summary (Signed)
Discharge instructions reviewed with patient including follow up appointments, signs of preeclampsia, signs of preterm labor, and when to seek medical attention. Instructions given for 24 hour urine collection. All questions answered. Patient discharged in stable condition, accompanied by significant other, gait steady.

## 2015-02-25 ENCOUNTER — Ambulatory Visit
Admission: RE | Admit: 2015-02-25 | Discharge: 2015-02-25 | Disposition: A | Discharge status: Home / Self Care | Payer: No Typology Code available for payment source | Source: Ambulatory Visit | Attending: Obstetrics and Gynecology | Admitting: Obstetrics and Gynecology

## 2015-02-25 VITALS — BP 117/81 | HR 79 | Temp 98.2°F | Wt 165.5 lb

## 2015-02-25 DIAGNOSIS — O09293 Supervision of pregnancy with other poor reproductive or obstetric history, third trimester: Secondary | ICD-10-CM

## 2015-02-25 DIAGNOSIS — Z87898 Personal history of other specified conditions: Secondary | ICD-10-CM

## 2015-02-25 LAB — LACTATE DEHYDROGENASE, ISOENZYMES
LDH 1: 15 % — ABNORMAL LOW (ref 17–32)
LDH 2: 31 % (ref 25–40)
LDH 3: 24 % (ref 17–27)
LDH 4: 14 % — ABNORMAL HIGH (ref 5–13)
LDH 5: 16 % (ref 4–20)
LDH ISOENZYMES, TOTAL: 165 IU/L (ref 119–226)

## 2015-02-27 LAB — OB RESULTS CONSOLE GBS: STREP GROUP B AG: NEGATIVE

## 2015-02-27 LAB — OB RESULTS CONSOLE GC/CHLAMYDIA
CHLAMYDIA, DNA PROBE: NEGATIVE
Gonorrhea: NEGATIVE

## 2015-02-28 NOTE — L&D Delivery Note (Signed)
Delivery Note At 9:25 AM a viable and healthy female "Kaylee Hunt" was delivered via Vaginal, Spontaneous Delivery (Presentation: Right Occiput Anterior) in a successful TOLAC.  APGAR: 8, 9; weight  .   Placenta status: Intact, Spontaneous.  Cord: 2 vessels with the following complications: None.  Anesthesia: None  Episiotomy: None Lacerations:  1st deg perineal, bilateral periurethral Suture Repair: 3.0 vicryl Est. Blood Loss (mL):  300  Mom to postpartum.  Baby to Couplet care / Skin to Skin.  Benjaman Kindler 03/11/2015, 10:15 AM

## 2015-03-04 NOTE — H&P (Signed)
Kaylee Hunt is a 30 y.o. female here for repeat c/s. G4P1 at 39+2 0n 03/15/15 ( [place] 03/19/14- based on LMP and confirmatory u/s ) . Pt with a prior c/s with last secondary to HELLP syndrome , breech and IUGR . Pts w/up for antiphospholipid and thrombophilia profile negative . No problems in this pregnancy . On ASA 81 mg qd . + THC use this pregnancy . Growth u/s : MFM and told it was nl ( no records ) . Pt is a [facility] pt and would like a repeat c/s .  Past Medical History:  has a past medical history of HELLP syndrome.  Past Surgical History:  has a past surgical history that includes Cesarean section (2010). Family History: family history includes Breast cancer (age of onset: 50) in her mother. Social History:  reports that she has never smoked. She does not have any smokeless tobacco history on file. She reports that she uses illicit drugs, including Other-see comments. She reports that she does not drink alcohol. OB/GYN History:  OB History    Gravida Para Term Preterm AB TAB SAB Ectopic Multiple Living   4 1  1 2  2   1       Allergies: has No Known Allergies. Medications:  Current Outpatient Prescriptions:  . aspirin 81 MG EC tablet, Take by mouth., Disp: , Rfl:  . prenatal vit no.124-iron-FA (PRENATAL VITAMIN) 27 mg iron- 800 mcg Tab, Take by mouth., Disp: , Rfl:   Review of Systems: General:   No fatigue or weight loss Eyes:   No vision changes Ears:   No hearing difficulty Respiratory:   No cough or shortness of breath Pulmonary:   No asthma or shortness of breath Cardiovascular:  No chest pain, palpitations, dyspnea on exertion Gastrointestinal:  No abdominal bloating, chronic diarrhea, constipations, masses, pain or hematochezia Genitourinary:  No hematuria, dysuria, abnormal vaginal discharge, pelvic pain, Menometrorrhagia Lymphatic:  No swollen lymph nodes Musculoskeletal: No muscle weakness Neurologic:  No extremity weakness, syncope, seizure disorder Psychiatric:   No history of depression, delusions or suicidal/homicidal ideation   Exam:      Vitals:   03/09/15 1334  BP: 114/71  Pulse: 101    Body mass index is 28.49 kg/(m^2).  WDWN white/ female in NAD  Lungs: CTA  CV : RRR without murmur   Neck: no thyromegaly Abdomen: gravid soft , no mass, normal active bowel sounds, non-tender, no rebound tenderness Pelvic: tanner stage 5 ,  External genitalia: vulva /labia no lesions Urethra: no prolapse Vagina: normal physiologic d/c Cervix: no lesions, no cervical motion tenderness  Uterus: gravid  Adnexa: no mass, non-tender  Rectovaginal: Impression:   Elective repeat LTCS , 39+2 on 03/15/15   Plan:   Benefits and risks to surgery: Repeat LTCS  The proposed benefit of the surgery has been discussed with the patient. The possible risks include, but are not limited to: organ injury to the bowel , bladder, ureters, and major blood vessels and nerves. There is a possibility of additional surgeries resulting from these injuries. There is also the risk of blood transfusion and the need to receive blood products during or after the procedure which may rarely lead to HIV or Hepatitis C infection. There is a risk of developing a deep venous thrombosis or a pulmonary embolism . There is the possibility of wound infection and also anesthetic complications, even the rare possibility of death. The patient understands these risks and wishes to proceed. All questions have been answered and  the consent has been signed.  Surgery scheduled for 03/15/2015 Preop The week of surgery

## 2015-03-11 ENCOUNTER — Inpatient Hospital Stay
Admission: EM | Admit: 2015-03-11 | Discharge: 2015-03-13 | DRG: 775 | Disposition: A | Discharge status: Home / Self Care | Payer: Medicaid Other | Attending: Obstetrics and Gynecology | Admitting: Obstetrics and Gynecology

## 2015-03-11 ENCOUNTER — Encounter: Payer: Self-pay | Admitting: *Deleted

## 2015-03-11 DIAGNOSIS — D62 Acute posthemorrhagic anemia: Secondary | ICD-10-CM | POA: Diagnosis not present

## 2015-03-11 DIAGNOSIS — O9081 Anemia of the puerperium: Secondary | ICD-10-CM | POA: Diagnosis not present

## 2015-03-11 DIAGNOSIS — F1290 Cannabis use, unspecified, uncomplicated: Secondary | ICD-10-CM | POA: Diagnosis present

## 2015-03-11 DIAGNOSIS — Z87898 Personal history of other specified conditions: Secondary | ICD-10-CM

## 2015-03-11 DIAGNOSIS — Z87891 Personal history of nicotine dependence: Secondary | ICD-10-CM | POA: Diagnosis not present

## 2015-03-11 DIAGNOSIS — O34211 Maternal care for low transverse scar from previous cesarean delivery: Secondary | ICD-10-CM | POA: Diagnosis present

## 2015-03-11 DIAGNOSIS — R109 Unspecified abdominal pain: Secondary | ICD-10-CM

## 2015-03-11 DIAGNOSIS — Z3A38 38 weeks gestation of pregnancy: Secondary | ICD-10-CM

## 2015-03-11 DIAGNOSIS — O09293 Supervision of pregnancy with other poor reproductive or obstetric history, third trimester: Secondary | ICD-10-CM

## 2015-03-11 DIAGNOSIS — O99324 Drug use complicating childbirth: Secondary | ICD-10-CM | POA: Diagnosis present

## 2015-03-11 LAB — BASIC METABOLIC PANEL
Anion gap: 19 — ABNORMAL HIGH (ref 5–15)
BUN: 8 mg/dL (ref 6–20)
CHLORIDE: 103 mmol/L (ref 101–111)
CO2: 12 mmol/L — AB (ref 22–32)
Calcium: 9.1 mg/dL (ref 8.9–10.3)
Creatinine, Ser: 0.82 mg/dL (ref 0.44–1.00)
GFR calc Af Amer: >60 mL/min (ref >60)
GFR calc non Af Amer: >60 mL/min (ref >60)
GLUCOSE: 153 mg/dL — AB (ref 65–99)
POTASSIUM: 3.7 mmol/L (ref 3.5–5.1)
Sodium: 134 mmol/L — ABNORMAL LOW (ref 135–145)

## 2015-03-11 LAB — URINE DRUG SCREEN, QUALITATIVE (ARMC ONLY)
Amphetamines, Ur Screen: NOT DETECTED
BENZODIAZEPINE, UR SCRN: NOT DETECTED
Barbiturates, Ur Screen: NOT DETECTED
CANNABINOID 50 NG, UR ~~LOC~~: POSITIVE — AB
Cocaine Metabolite,Ur ~~LOC~~: NOT DETECTED
MDMA (Ecstasy)Ur Screen: NOT DETECTED
Methadone Scn, Ur: NOT DETECTED
OPIATE, UR SCREEN: NOT DETECTED
PHENCYCLIDINE (PCP) UR S: NOT DETECTED
Tricyclic, Ur Screen: NOT DETECTED

## 2015-03-11 LAB — PROTEIN / CREATININE RATIO, URINE
CREATININE, URINE: 49 mg/dL
PROTEIN CREATININE RATIO: 2.27 mg/mg{creat} — AB (ref 0.00–0.15)
TOTAL PROTEIN, URINE: 111 mg/dL

## 2015-03-11 LAB — HEPATIC FUNCTION PANEL
ALT: 11 U/L — ABNORMAL LOW (ref 14–54)
AST: 16 U/L (ref 15–41)
Albumin: 3.4 g/dL — ABNORMAL LOW (ref 3.5–5.0)
Alkaline Phosphatase: 233 U/L — ABNORMAL HIGH (ref 38–126)
BILIRUBIN DIRECT: 0.3 mg/dL (ref 0.1–0.5)
BILIRUBIN INDIRECT: 1.8 mg/dL — AB (ref 0.3–0.9)
TOTAL PROTEIN: 7.4 g/dL (ref 6.5–8.1)
Total Bilirubin: 2.1 mg/dL — ABNORMAL HIGH (ref 0.3–1.2)

## 2015-03-11 LAB — CBC
HEMATOCRIT: 37.4 % (ref 35.0–47.0)
Hemoglobin: 11.9 g/dL — ABNORMAL LOW (ref 12.0–16.0)
MCH: 25.3 pg — AB (ref 26.0–34.0)
MCHC: 31.7 g/dL — AB (ref 32.0–36.0)
MCV: 79.6 fL — AB (ref 80.0–100.0)
Platelets: 173 10*3/uL (ref 150–440)
RBC: 4.71 MIL/uL (ref 3.80–5.20)
RDW: 14.5 % (ref 11.5–14.5)
WBC: 31.8 10*3/uL — ABNORMAL HIGH (ref 3.6–11.0)

## 2015-03-11 LAB — RAPID HIV SCREEN (HIV 1/2 AB+AG)
HIV 1/2 ANTIBODIES: NONREACTIVE
HIV-1 P24 Antigen - HIV24: NONREACTIVE

## 2015-03-11 LAB — TYPE AND SCREEN
ABO/RH(D): A POS
Antibody Screen: NEGATIVE

## 2015-03-11 LAB — ABO/RH: ABO/RH(D): A POS

## 2015-03-11 MED ORDER — CEFAZOLIN SODIUM-DEXTROSE 2-3 GM-% IV SOLR: 2.0000 g | INTRAVENOUS | Status: DC

## 2015-03-11 MED ORDER — LACTATED RINGERS IV SOLN: 500.0000 mL | INTRAVENOUS | Status: DC | PRN

## 2015-03-11 MED ORDER — ZOLPIDEM TARTRATE 5 MG PO TABS: 5.0000 mg | ORAL_TABLET | Freq: Every evening | ORAL | Status: DC | PRN

## 2015-03-11 MED ORDER — ONDANSETRON HCL 4 MG/2ML IJ SOLN: 4.0000 mg | INTRAMUSCULAR | Status: DC | PRN

## 2015-03-11 MED ORDER — OXYCODONE-ACETAMINOPHEN 5-325 MG PO TABS: 2.0000 | ORAL_TABLET | ORAL | Status: DC | PRN

## 2015-03-11 MED ORDER — MEASLES, MUMPS & RUBELLA VAC ~~LOC~~ INJ
0.5000 mL | INJECTION | Freq: Once | SUBCUTANEOUS | Status: DC
  Filled 2015-03-11: qty 0.5

## 2015-03-11 MED ORDER — LACTATED RINGERS IV SOLN
INTRAVENOUS | Status: DC
  Administered 2015-03-11: 09:00:00 via INTRAVENOUS

## 2015-03-11 MED ORDER — OXYTOCIN 40 UNITS IN LACTATED RINGERS INFUSION - SIMPLE MED
INTRAVENOUS | Status: AC
  Administered 2015-03-11: 40 [IU]
  Filled 2015-03-11: qty 1000

## 2015-03-11 MED ORDER — SODIUM CHLORIDE 0.9 % IJ SOLN
3.0000 mL | Freq: Two times a day (BID) | INTRAMUSCULAR | Status: DC
Start: 2015-03-11 — End: 2015-03-11

## 2015-03-11 MED ORDER — BENZOCAINE-MENTHOL 20-0.5 % EX AERO: 1.0000 "application " | INHALATION_SPRAY | CUTANEOUS | Status: DC | PRN

## 2015-03-11 MED ORDER — BUTORPHANOL TARTRATE 1 MG/ML IJ SOLN: 1.0000 mg | INTRAMUSCULAR | Status: DC | PRN

## 2015-03-11 MED ORDER — OXYTOCIN 10 UNIT/ML IJ SOLN
INTRAMUSCULAR | Status: AC
  Filled 2015-03-11: qty 2

## 2015-03-11 MED ORDER — ONDANSETRON HCL 4 MG PO TABS: 4.0000 mg | ORAL_TABLET | ORAL | Status: DC | PRN

## 2015-03-11 MED ORDER — SENNOSIDES-DOCUSATE SODIUM 8.6-50 MG PO TABS
2.0000 | ORAL_TABLET | ORAL | Status: DC
  Administered 2015-03-11: 2 via ORAL
  Filled 2015-03-11 (×2): qty 2

## 2015-03-11 MED ORDER — TETANUS-DIPHTH-ACELL PERTUSSIS 5-2.5-18.5 LF-MCG/0.5 IM SUSP: 0.5000 mL | Freq: Once | INTRAMUSCULAR | Status: DC

## 2015-03-11 MED ORDER — OXYTOCIN BOLUS FROM INFUSION: 500.0000 mL | INTRAVENOUS | Status: DC

## 2015-03-11 MED ORDER — CITRIC ACID-SODIUM CITRATE 334-500 MG/5ML PO SOLN: 30.0000 mL | ORAL | Status: DC

## 2015-03-11 MED ORDER — DIBUCAINE 1 % RE OINT: 1.0000 "application " | TOPICAL_OINTMENT | RECTAL | Status: DC | PRN

## 2015-03-11 MED ORDER — AMMONIA AROMATIC IN INHA
RESPIRATORY_TRACT | Status: AC
  Filled 2015-03-11: qty 10

## 2015-03-11 MED ORDER — ACETAMINOPHEN 325 MG PO TABS: 650.0000 mg | ORAL_TABLET | ORAL | Status: DC | PRN

## 2015-03-11 MED ORDER — DIPHENHYDRAMINE HCL 25 MG PO CAPS: 25.0000 mg | ORAL_CAPSULE | Freq: Four times a day (QID) | ORAL | Status: DC | PRN

## 2015-03-11 MED ORDER — LIDOCAINE HCL (PF) 1 % IJ SOLN
30.0000 mL | INTRAMUSCULAR | Status: DC | PRN
  Administered 2015-03-11: 30 mL via SUBCUTANEOUS
  Filled 2015-03-11: qty 30

## 2015-03-11 MED ORDER — SODIUM CHLORIDE 0.9 % IJ SOLN: 3.0000 mL | INTRAMUSCULAR | Status: DC | PRN

## 2015-03-11 MED ORDER — SODIUM CHLORIDE 0.9 % IV SOLN: 250.0000 mL | INTRAVENOUS | Status: DC | PRN

## 2015-03-11 MED ORDER — SIMETHICONE 80 MG PO CHEW: 80.0000 mg | CHEWABLE_TABLET | ORAL | Status: DC | PRN

## 2015-03-11 MED ORDER — BISACODYL 10 MG RE SUPP: 10.0000 mg | Freq: Every day | RECTAL | Status: DC | PRN

## 2015-03-11 MED ORDER — ONDANSETRON HCL 4 MG/2ML IJ SOLN: 4.0000 mg | Freq: Four times a day (QID) | INTRAMUSCULAR | Status: DC | PRN

## 2015-03-11 MED ORDER — PRENATAL MULTIVITAMIN CH
1.0000 | ORAL_TABLET | Freq: Every day | ORAL | Status: DC
Start: 2015-03-11 — End: 2015-03-11

## 2015-03-11 MED ORDER — IBUPROFEN 600 MG PO TABS
600.0000 mg | ORAL_TABLET | Freq: Four times a day (QID) | ORAL | Status: DC
  Administered 2015-03-11 – 2015-03-13 (×7): 600 mg via ORAL
  Filled 2015-03-11 (×7): qty 1

## 2015-03-11 MED ORDER — PRENATAL MULTIVITAMIN CH: 1.0000 | ORAL_TABLET | Freq: Every day | ORAL | Status: DC

## 2015-03-11 MED ORDER — FLEET ENEMA 7-19 GM/118ML RE ENEM: 1.0000 | ENEMA | Freq: Every day | RECTAL | Status: DC | PRN

## 2015-03-11 MED ORDER — WITCH HAZEL-GLYCERIN EX PADS: 1.0000 "application " | MEDICATED_PAD | CUTANEOUS | Status: DC | PRN

## 2015-03-11 MED ORDER — CITRIC ACID-SODIUM CITRATE 334-500 MG/5ML PO SOLN: 30.0000 mL | ORAL | Status: DC | PRN

## 2015-03-11 MED ORDER — LIDOCAINE HCL (PF) 1 % IJ SOLN
INTRAMUSCULAR | Status: AC
  Administered 2015-03-11: 30 mL via SUBCUTANEOUS
  Filled 2015-03-11: qty 30

## 2015-03-11 MED ORDER — BENZOCAINE-MENTHOL 20-0.5 % EX AERO
INHALATION_SPRAY | CUTANEOUS | Status: AC
  Filled 2015-03-11: qty 56

## 2015-03-11 MED ORDER — OXYCODONE-ACETAMINOPHEN 5-325 MG PO TABS: 1.0000 | ORAL_TABLET | ORAL | Status: DC | PRN

## 2015-03-11 MED ORDER — MISOPROSTOL 200 MCG PO TABS
ORAL_TABLET | ORAL | Status: AC
  Filled 2015-03-11: qty 4

## 2015-03-11 MED ORDER — LANOLIN HYDROUS EX OINT: TOPICAL_OINTMENT | CUTANEOUS | Status: DC | PRN

## 2015-03-11 NOTE — H&P (Signed)
Kaylee Hunt is a 30 y.o. female [phone] at 38+5wks presenting for contractions x12 hours. No SROM, no vaginal bleeding, good fetal movement.  Prior LTCS at 32wks for severe PreE with HELLP syndrome, breech and IUGR. Pts w/up for antiphospholipid and thrombophilia profile negative . No problems in this pregnancy . On ASA 81 mg qd . + THC use this pregnancy . Growth u/s : MFM and told it was nl ( no records )   Planned repeat C/S next week.  History OB History    Gravida Para Term Preterm AB TAB SAB Ectopic Multiple Living   4 1  1 2  2   1      Past Medical History  Diagnosis Date  . Hypertension    Past Surgical History  Procedure Laterality Date  . Cesarean section     Family History: family history is not on file. Social History:  reports that she quit smoking about 6 years ago. Her smoking use included Cigarettes. She has never used smokeless tobacco. She reports that she uses illicit drugs (Marijuana). She reports that she does not drink alcohol.   Prenatal Transfer Tool  Maternal Diabetes: No Maternal Ultrasounds/Referrals: Normal Fetal Ultrasounds or other Referrals:  None Maternal Substance Abuse:  Yes:  Type: Marijuana Significant Maternal Medications:  Meds include: Other: ASA 81mg /d Significant Maternal Lab Results:  None Other Comments:  None  ROS    Last menstrual period 06/13/2014. Exam Physical Exam  Prenatal labs: ABO, Rh:   A+ Antibody:   neg Rubella:  immune RPR:   non reactive HBsAg:   neg HIV:   neg GBS:   neg  Assessment/Plan: Admitted at fully dilated and +2 station with urge to push. Brief discussion of TOLAC vs repeat LTCS in this circumstance with r/b of each. Pt would like to continue with vaginal delivery. Plan for AROM and anticipate vaginal delivery. GBS neg Prior hx of HELLP: Will get stat labs, serial BP. Fetal status reassuring- Cat I prior to pushing   Camila Maita 03/11/2015, 8:39 AM  Addendum: Pt delivered over intact  perineum with meconium stained fluid without complication.

## 2015-03-11 NOTE — Plan of Care (Signed)
Arrived to OBS 2 c/o contractions.  Changed to gown and to bed.  EFM applied.  SVE complete/+2/membranes intact. Previous c/section.

## 2015-03-12 ENCOUNTER — Inpatient Hospital Stay: Admission: RE | Admit: 2015-03-12 | Payer: Medicaid Other | Source: Ambulatory Visit

## 2015-03-12 LAB — CBC
HEMATOCRIT: 31.1 % — AB (ref 35.0–47.0)
HEMOGLOBIN: 10.1 g/dL — AB (ref 12.0–16.0)
MCH: 25.4 pg — ABNORMAL LOW (ref 26.0–34.0)
MCHC: 32.5 g/dL (ref 32.0–36.0)
MCV: 78.1 fL — ABNORMAL LOW (ref 80.0–100.0)
Platelets: 145 10*3/uL — ABNORMAL LOW (ref 150–440)
RBC: 3.99 MIL/uL (ref 3.80–5.20)
RDW: 14.7 % — AB (ref 11.5–14.5)
WBC: 15.9 10*3/uL — AB (ref 3.6–11.0)

## 2015-03-12 LAB — RPR: RPR Ser Ql: NONREACTIVE

## 2015-03-12 NOTE — Progress Notes (Signed)
OBGYN MD NOTE:   Post Partum Day 1 Subjective: no complaints, up ad lib, voiding, tolerating PO and + flatus  Objective: Blood pressure 112/70, pulse 72, temperature 97.9 F (36.6 C), temperature source Oral, resp. rate 18, height 5\' 4"  (1.626 m), weight 75.07 kg (165 lb 8 oz), last menstrual period 06/13/2014, SpO2 99 %, unknown if currently breastfeeding.  Physical Exam:  General: alert, cooperative and no distress Lochia: appropriate Uterine Fundus: firm Incision: no significant drainage, no dehiscence DVT Evaluation: No evidence of DVT seen on physical exam.   Recent Labs  03/11/15 0920 03/12/15 0737  HGB 11.9* 10.1*  HCT 37.4 31.1*    Assessment/Plan: Plan for discharge tomorrow and Contraception mirena IUD pp visit  Bottle Feeding Iron for acute blood loss anemia   LOS: 1 day   Lorette Ang 03/12/2015, 9:10 AM

## 2015-03-13 MED ORDER — SENNOSIDES-DOCUSATE SODIUM 8.6-50 MG PO TABS: 2.0000 | ORAL_TABLET | ORAL | Status: DC

## 2015-03-13 MED ORDER — IBUPROFEN 600 MG PO TABS: 600.0000 mg | ORAL_TABLET | Freq: Four times a day (QID) | ORAL | Status: DC

## 2015-03-13 MED ORDER — FERROUS GLUCONATE 324 (38 FE) MG PO TABS: 324.0000 mg | ORAL_TABLET | Freq: Every day | ORAL | Status: DC

## 2015-03-13 NOTE — Discharge Instructions (Signed)
°  Take it easy, no heavy lifting of more than 10 # til recheck appt. Keep follow up appt. For 4 weeks, call for appt. No tub baths til 4 week appt. Can shower. Take Prenatal vitamins if breastfeeding. Go back to regular calorie diet if not breast feeding. Sleep when baby sleeps. Dry perinium area well after shower. Bleeding will diminish after 1-2 weeks. If it increases, or there is an increase in abd. Pain, or you have trouble peeing, contact your physician immediately. If you feel you are feeling worse and not better each day contact your physician. Baby blues are normal for 2 weeks, if you are blue for more than a month contact your physician.

## 2015-03-13 NOTE — Progress Notes (Signed)
OBGYN MD NOTE:   Post Partum Day 2 Subjective: no complaints, up ad lib, voiding, tolerating PO and + flatus  Objective: Blood pressure 114/59, pulse 60, temperature 98.2 F (36.8 C), temperature source Oral, resp. rate 18, height 5\' 4"  (1.626 m), weight 75.07 kg (165 lb 8 oz), last menstrual period 06/13/2014, SpO2 100 %, unknown if currently breastfeeding.  Physical Exam:  General: alert, cooperative and no distress Lochia: appropriate Uterine Fundus: firm Incision: no significant drainage, no dehiscence DVT Evaluation: No evidence of DVT seen on physical exam.   Recent Labs  03/11/15 0920 03/12/15 0737  HGB 11.9* 10.1*  HCT 37.4 31.1*    Assessment/Plan: Bottle Feeding Iron for acute blood loss anemia Colace rx Ibuprofen for pain control F/u in 4wks for pp visit and IUD placement with [facility]  D/c home   LOS: 2 days   Lorette Ang 03/13/2015, 9:28 AM

## 2015-03-13 NOTE — Discharge Summary (Signed)
Obstetric Discharge Summary Reason for Admission: onset of labor Prenatal Procedures: none Intrapartum Procedures: spontaneous vaginal delivery Postpartum Procedures: none Complications-Operative and Postpartum: 1st degree perineal laceration HEMOGLOBIN  Date Value Ref Range Status  03/12/2015 10.1* 12.0 - 16.0 g/dL Final   HGB  Date Value Ref Range Status  11/21/2013 12.8 12.0-16.0 g/dL Final   HCT  Date Value Ref Range Status  03/12/2015 31.1* 35.0 - 47.0 % Final  11/21/2013 39.0 35.0-47.0 % Final    Physical Exam:  General: alert, cooperative and no distress Lochia: appropriate Uterine Fundus: firm Incision: healing well DVT Evaluation: No evidence of DVT seen on physical exam.  Discharge Diagnoses: Term Pregnancy-delivered  Discharge Information: Date: 03/13/2015 Activity: pelvic rest Diet: routine Medications: Ibuprofen, Colace and Iron Condition: stable Instructions: refer to practice specific booklet Discharge to: home Follow-up Information    Follow up with [facility] In 4 weeks.   Contact information:   [place] [address] [phone]       Newborn Data: Live born female  Birth Weight: 6 lb 13 oz (3090 g) APGAR: 8, 9  Home with mother.  Kaylee Hunt Kaylee Hunt 03/13/2015, 12:39 PM

## 2015-03-15 ENCOUNTER — Encounter: Admission: RE | Payer: Self-pay | Source: Ambulatory Visit

## 2015-03-15 ENCOUNTER — Inpatient Hospital Stay
Admission: RE | Admit: 2015-03-15 | Payer: Medicaid Other | Source: Ambulatory Visit | Admitting: Obstetrics and Gynecology

## 2015-03-15 SURGERY — Surgical Case
Anesthesia: Choice

## 2016-01-05 ENCOUNTER — Encounter: Payer: Self-pay | Admitting: Emergency Medicine

## 2016-01-05 ENCOUNTER — Emergency Department
Admission: EM | Admit: 2016-01-05 | Discharge: 2016-01-05 | Disposition: A | Discharge status: Home / Self Care | Payer: Medicaid Other | Attending: Emergency Medicine | Admitting: Emergency Medicine

## 2016-01-05 DIAGNOSIS — Z79899 Other long term (current) drug therapy: Secondary | ICD-10-CM | POA: Insufficient documentation

## 2016-01-05 DIAGNOSIS — R0981 Nasal congestion: Secondary | ICD-10-CM

## 2016-01-05 DIAGNOSIS — I10 Essential (primary) hypertension: Secondary | ICD-10-CM | POA: Insufficient documentation

## 2016-01-05 DIAGNOSIS — Z87891 Personal history of nicotine dependence: Secondary | ICD-10-CM | POA: Insufficient documentation

## 2016-01-05 DIAGNOSIS — J3089 Other allergic rhinitis: Secondary | ICD-10-CM | POA: Insufficient documentation

## 2016-01-05 MED ORDER — FLUTICASONE PROPIONATE 50 MCG/ACT NA SUSP: 2.0000 | Freq: Every day | NASAL | 0 refills | Status: DC

## 2016-01-05 MED ORDER — BENZONATATE 100 MG PO CAPS: 100.0000 mg | ORAL_CAPSULE | Freq: Three times a day (TID) | ORAL | 0 refills | Status: DC | PRN

## 2016-01-05 MED ORDER — CETIRIZINE-PSEUDOEPHEDRINE ER 5-120 MG PO TB12: 1.0000 | ORAL_TABLET | Freq: Two times a day (BID) | ORAL | 0 refills | Status: DC

## 2016-01-05 NOTE — ED Triage Notes (Signed)
C/o cough and congestion.  NAD

## 2016-01-05 NOTE — ED Notes (Signed)
States she developed some body aches yesterday with chest and nasal congestion  Occasional cough  No fever

## 2016-01-05 NOTE — Discharge Instructions (Signed)
Take the prescription meds as directed. Continue to monitor and treat any fevers that may arise. Follow-up with the Health Department as needed.

## 2016-01-05 NOTE — ED Provider Notes (Signed)
[facility] Emergency Department Provider Note ____________________________________________  Time seen: 1041  I have reviewed the triage vital signs and the nursing notes.  HISTORY  Chief Complaint  Nasal Congestion  HPI Kaylee Hunt is a 30 y.o. female presents to the ED with a one day complaint of cough, congestion, sore throat, and runny nose. Patient denies any interim fevers, chills, sweats. She's been taking Mucinex and Advil for symptom relief. She did not report receiving a seasonal flu vaccine. She denies any sick contacts, recent travel, or other exposures.  Past Medical History:  Diagnosis Date  . Hypertension     Patient Active Problem List   Diagnosis Date Noted  . Abdominal pain 03/11/2015  . Indication for care or intervention related to labor and delivery 03/11/2015  . History of poor fetal growth 09/10/2014  . Hx of preeclampsia, prior pregnancy, currently pregnant 09/10/2014    Past Surgical History:  Procedure Laterality Date  . CESAREAN SECTION      Prior to Admission medications   Medication Sig Start Date End Date Taking? Authorizing Provider  pseudoephedrine-guaifenesin (MUCINEX D) 60-600 MG 12 hr tablet Take 1 tablet by mouth every 12 (twelve) hours.   Yes Historical Provider, MD  benzonatate (TESSALON PERLES) 100 MG capsule Take 1 capsule (100 mg total) by mouth 3 (three) times daily as needed for cough (Take 1-2 per dose). 01/05/16   Jamaria Amborn V Bacon Rufino Staup, PA-C  cetirizine-pseudoephedrine (ZYRTEC-D) 5-120 MG tablet Take 1 tablet by mouth 2 (two) times daily. 01/05/16   Trixy Loyola V Bacon Vittorio Mohs, PA-C  ferrous gluconate (FERGON) 324 MG tablet Take 1 tablet (324 mg total) by mouth daily with breakfast. 03/13/15   Lorette Ang, MD  fluticasone (FLONASE) 50 MCG/ACT nasal spray Place 2 sprays into both nostrils daily. 01/05/16   Mailey Landstrom V Bacon Modelle Vollmer, PA-C  ibuprofen (ADVIL,MOTRIN) 600 MG tablet Take 1 tablet (600 mg total) by mouth  every 6 (six) hours. 03/13/15   Lorette Ang, MD  senna-docusate (SENOKOT-S) 8.6-50 MG tablet Take 2 tablets by mouth daily. 03/13/15   Lorette Ang, MD    Allergies Patient has no known allergies.  No family history on file.  Social History Social History  Substance Use Topics  . Smoking status: Former Smoker    Types: Cigarettes    Quit date: 09/09/2008  . Smokeless tobacco: Never Used  . Alcohol use No     Comment: denies    Review of Systems  Constitutional: Negative for fever. Eyes: Negative for visual changes. ENT: Positive for sore throat, runny nose, and sinus congestion. Cardiovascular: Negative for chest pain. Respiratory: Negative for shortness of breath. Reports nonproductive cough. Skin: Negative for rash. ____________________________________________  PHYSICAL EXAM:  VITAL SIGNS: ED Triage Vitals  Enc Vitals Group     BP 01/05/16 0956 134/86     Pulse Rate 01/05/16 0956 94     Resp 01/05/16 0956 18     Temp 01/05/16 0956 98.2 F (36.8 C)     Temp Source 01/05/16 0956 Oral     SpO2 01/05/16 0956 99 %     Weight 01/05/16 0956 130 lb (59 kg)     Height 01/05/16 0956 5\' 4"  (1.626 m)     Head Circumference --      Peak Flow --      Pain Score 01/05/16 0954 5     Pain Loc --      Pain Edu? --      Excl. in  [place]? --    Constitutional: Alert and oriented. Well appearing and in no distress. Head: Normocephalic and atraumatic. Eyes: Conjunctivae are normal. PERRL. Normal extraocular movements Ears: Canals clear. TMs intact bilaterally. Nose: No congestion/rhinorrhea/epistaxis.Some mildly edematous, pink, moist nasal tenderness or appreciated. Clear rhinorrhea noted Mouth/Throat: Mucous membranes are moist. Uvula is midline and tonsils are flat. Neck: Supple. No thyromegaly. Hematological/Lymphatic/Immunological: No cervical lymphadenopathy. Cardiovascular: Normal rate, regular rhythm. Normal distal pulses. Respiratory: Normal respiratory effort. No  wheezes/rales/rhonchi. Gastrointestinal: Soft and nontender. No distention. Skin:  Skin is warm, dry and intact. No rash noted. ____________________________________________  INITIAL IMPRESSION / ASSESSMENT AND PLAN / ED COURSE  Patient with symptoms consistent with nasal congestion and allergic rhinitis. She is discharged with prescriptions for Flonase, Tessalon Perles, and cetirizine-pseudoephedrine. She will continue Mucinex as needed. She will follow with her primary care provider for ongoing symptom management. Work note is provided for 1 day as requested.  Clinical Course    ____________________________________________  FINAL CLINICAL IMPRESSION(S) / ED DIAGNOSES  Final diagnoses:  Nasal congestion  Acute non-seasonal allergic rhinitis, unspecified trigger      Melvenia Needles, PA-C 01/05/16 [place], MD 01/06/16 2021

## 2018-05-04 ENCOUNTER — Ambulatory Visit
Admission: EM | Admit: 2018-05-04 | Discharge: 2018-05-04 | Disposition: A | Discharge status: Home / Self Care | Payer: PRIVATE HEALTH INSURANCE | Attending: Family Medicine | Admitting: Family Medicine

## 2018-05-04 DIAGNOSIS — J111 Influenza due to unidentified influenza virus with other respiratory manifestations: Secondary | ICD-10-CM

## 2018-05-04 DIAGNOSIS — R69 Illness, unspecified: Principal | ICD-10-CM

## 2018-05-04 DIAGNOSIS — R05 Cough: Secondary | ICD-10-CM | POA: Diagnosis not present

## 2018-05-04 DIAGNOSIS — R509 Fever, unspecified: Secondary | ICD-10-CM

## 2018-05-04 DIAGNOSIS — R0981 Nasal congestion: Secondary | ICD-10-CM | POA: Diagnosis not present

## 2018-05-04 LAB — RAPID INFLUENZA A&B ANTIGENS
Influenza A (ARMC): NEGATIVE
Influenza B (ARMC): NEGATIVE

## 2018-05-04 LAB — RAPID STREP SCREEN (MED CTR MEBANE ONLY): Streptococcus, Group A Screen (Direct): NEGATIVE

## 2018-05-04 MED ORDER — OSELTAMIVIR PHOSPHATE 75 MG PO CAPS: 75.0000 mg | ORAL_CAPSULE | Freq: Two times a day (BID) | ORAL | 0 refills | Status: DC

## 2018-05-04 MED ORDER — ONDANSETRON 8 MG PO TBDP: 8.0000 mg | ORAL_TABLET | Freq: Two times a day (BID) | ORAL | 0 refills | Status: DC

## 2018-05-04 MED ORDER — BENZONATATE 200 MG PO CAPS: ORAL_CAPSULE | ORAL | 0 refills | Status: DC

## 2018-05-04 NOTE — ED Triage Notes (Addendum)
Pt here for body aches, chills, headache, nasal congestion since last night and then vomiting this morning. No fever reported or otc meds tried. But does report some sore throat.

## 2018-05-04 NOTE — Discharge Instructions (Addendum)
Drink plenty of fluids.  Rest as much as possible.  Use Tylenol or Motrin for fever chills or body aches.  °

## 2018-05-04 NOTE — ED Provider Notes (Signed)
[facility]    CSN: [id number] Arrival date & time: 05/04/18  1110     History   Chief Complaint Chief Complaint  Patient presents with  . Generalized Body Aches    HPI Kaylee Hunt is a 33 y.o. female.   HPI  -year-old female presents with the sudden onset of body aches chills headache nasal congestion and cough that she has had since last night.  She did vomit this morning probably from gagging from the thick mucus.  She had no fever has had chills and  has not been using over-the-counter medications.  He does not usually receive a flu shot.  Works as a Educational psychologist        Past Medical History:  Diagnosis Date  . Hypertension     Patient Active Problem List   Diagnosis Date Noted  . Abdominal pain 03/11/2015  . Indication for care or intervention related to labor and delivery 03/11/2015  . History of poor fetal growth 09/10/2014  . Hx of preeclampsia, prior pregnancy, currently pregnant 09/10/2014    Past Surgical History:  Procedure Laterality Date  . CESAREAN SECTION      OB History    Gravida  4   Para  2   Term  1   Preterm  1   AB  2   Living  1     SAB  2   TAB      Ectopic      Multiple  0   Live Births  1            Home Medications    Prior to Admission medications   Medication Sig Start Date End Date Taking? Authorizing Provider  levonorgestrel (MIRENA) 20 MCG/24HR IUD by Intrauterine route.   Yes [provider]  benzonatate (TESSALON) 200 MG capsule Take one cap TID PRN cough 05/04/18   Lorin Picket, PA-C  cetirizine-pseudoephedrine (ZYRTEC-D) 5-120 MG tablet Take 1 tablet by mouth 2 (two) times daily. 01/05/16   Menshew, Dannielle Karvonen, PA-C  ferrous gluconate (FERGON) 324 MG tablet Take 1 tablet (324 mg total) by mouth daily with breakfast. 03/13/15   Halfon, Guerry Bruin, MD  ibuprofen (ADVIL,MOTRIN) 600 MG tablet Take 1 tablet (600 mg total) by mouth every 6 (six) hours. 03/13/15   Halfon, Guerry Bruin, MD   ondansetron (ZOFRAN ODT) 8 MG disintegrating tablet Take 1 tablet (8 mg total) by mouth 2 (two) times daily. As necessary for nausea or vomiting 05/04/18   Lorin Picket, PA-C  oseltamivir (TAMIFLU) 75 MG capsule Take 1 capsule (75 mg total) by mouth every 12 (twelve) hours. 05/04/18   Lorin Picket, PA-C  senna-docusate (SENOKOT-S) 8.6-50 MG tablet Take 2 tablets by mouth daily. 03/13/15   Halfon, Guerry Bruin, MD    Family History Family History  Problem Relation Age of Onset  . Cancer Mother   . Cancer Father     Social History Social History   Tobacco Use  . Smoking status: Former Smoker    Types: Cigarettes    Last attempt to quit: 09/09/2008    Years since quitting: 9.6  . Smokeless tobacco: Never Used  Substance Use Topics  . Alcohol use: No    Comment: denies  . Drug use: Yes    Types: Marijuana    Comment: occasional use     Allergies   Patient has no known allergies.   Review of Systems Review of Systems  Constitutional: Positive for activity  change, appetite change, chills and fatigue. Negative for fever.  HENT: Positive for congestion, postnasal drip, rhinorrhea, sinus pressure, sinus pain and sore throat.   Respiratory: Positive for cough.   Gastrointestinal: Positive for vomiting. Negative for abdominal distention, abdominal pain, diarrhea and nausea.  All other systems reviewed and are negative.    Physical Exam Triage Vital Signs ED Triage Vitals  Enc Vitals Group     BP 05/04/18 1119 119/64     Pulse Rate 05/04/18 1119 92     Resp 05/04/18 1119 18     Temp 05/04/18 1119 98.1 F (36.7 C)     Temp Source 05/04/18 1119 Oral     SpO2 05/04/18 1119 100 %     Weight 05/04/18 1121 120 lb (54.4 kg)     Height 05/04/18 1121 5\' 4"  (1.626 m)     Head Circumference --      Peak Flow --      Pain Score 05/04/18 1121 4     Pain Loc --      Pain Edu? --      Excl. in [place]? --    No data found.  Updated Vital Signs BP 119/64 (BP Location: Left Arm)    Pulse 92   Temp 98.1 F (36.7 C) (Oral)   Resp 18   Ht 5\' 4"  (1.626 m)   Wt 120 lb (54.4 kg)   LMP  (LMP Unknown)   SpO2 100%   Breastfeeding No   BMI 20.60 kg/m   Visual Acuity Right Eye Distance:   Left Eye Distance:   Bilateral Distance:    Right Eye Near:   Left Eye Near:    Bilateral Near:     Physical Exam Vitals signs and nursing note reviewed.  Constitutional:      General: She is not in acute distress.    Appearance: Normal appearance. She is normal weight. She is not ill-appearing, toxic-appearing or diaphoretic.  HENT:     Head: Normocephalic and atraumatic.     Right Ear: Tympanic membrane, ear canal and external ear normal.     Left Ear: Tympanic membrane, ear canal and external ear normal.     Nose: Congestion and rhinorrhea present.     Mouth/Throat:     Mouth: Mucous membranes are moist.     Pharynx: Oropharynx is clear.  Eyes:     General:        Right eye: No discharge.        Left eye: No discharge.     Conjunctiva/sclera: Conjunctivae normal.  Neck:     Musculoskeletal: Normal range of motion and neck supple. No neck rigidity.  Pulmonary:     Effort: Pulmonary effort is normal.     Breath sounds: Normal breath sounds.  Musculoskeletal: Normal range of motion.  Lymphadenopathy:     Cervical: No cervical adenopathy.  Skin:    General: Skin is warm and dry.  Neurological:     General: No focal deficit present.     Mental Status: She is alert and oriented to person, place, and time.  Psychiatric:        Mood and Affect: Mood normal.        Behavior: Behavior normal.        Thought Content: Thought content normal.        Judgment: Judgment normal.      UC Treatments / Results  Labs (all labs ordered are listed, but only abnormal results are displayed) Labs Reviewed  RAPID INFLUENZA A&B ANTIGENS ([facility] ONLY)  RAPID STREP SCREEN ([facility] ONLY)  CULTURE, GROUP A STREP [facility])    EKG None  Radiology No results  found.  Procedures Procedures (including critical care time)  Medications Ordered in UC Medications - No data to display  Initial Impression / Assessment and Plan / UC Course  I have reviewed the triage vital signs and the nursing notes.  Pertinent labs & imaging results that were available during my care of the patient were reviewed by me and considered in my medical decision making (see chart for details).   Patient has a flulike illness.  Will treat with Tamiflu ,Tessalon Perles Zofran for the nausea.  Out of work through Tuesday.  Is worsening she should go to the emergency room   Final Clinical Impressions(s) / UC Diagnoses   Final diagnoses:  Influenza-like illness     Discharge Instructions     Drink plenty of fluids.  Rest as much as possible.  Use Tylenol or Motrin for fever chills or body aches.    ED Prescriptions    Medication Sig Dispense Auth. Provider   oseltamivir (TAMIFLU) 75 MG capsule Take 1 capsule (75 mg total) by mouth every 12 (twelve) hours. 10 capsule Crecencio Mc P, PA-C   benzonatate (TESSALON) 200 MG capsule Take one cap TID PRN cough 30 capsule Crecencio Mc P, PA-C   ondansetron (ZOFRAN ODT) 8 MG disintegrating tablet Take 1 tablet (8 mg total) by mouth 2 (two) times daily. As necessary for nausea or vomiting 6 tablet Lorin Picket, PA-C     Controlled Substance Prescriptions [place] Controlled Substance Registry consulted? Not Applicable   Lorin Picket, PA-C 05/04/18 1228

## 2018-05-07 LAB — CULTURE, GROUP A STREP (THRC)

## 2018-10-29 DIAGNOSIS — D242 Benign neoplasm of left breast: Secondary | ICD-10-CM

## 2018-10-29 DIAGNOSIS — Z91410 Personal history of adult physical and sexual abuse: Secondary | ICD-10-CM

## 2018-10-29 DIAGNOSIS — Z8751 Personal history of pre-term labor: Secondary | ICD-10-CM

## 2018-10-30 ENCOUNTER — Other Ambulatory Visit: Payer: Self-pay

## 2018-10-30 ENCOUNTER — Encounter: Payer: Self-pay | Admitting: Family Medicine

## 2018-10-30 ENCOUNTER — Ambulatory Visit: Payer: PRIVATE HEALTH INSURANCE | Admitting: Family Medicine

## 2018-10-30 VITALS — BP 116/70 | Ht 64.0 in | Wt 139.8 lb

## 2018-10-30 DIAGNOSIS — Z113 Encounter for screening for infections with a predominantly sexual mode of transmission: Secondary | ICD-10-CM

## 2018-10-30 DIAGNOSIS — N739 Female pelvic inflammatory disease, unspecified: Secondary | ICD-10-CM

## 2018-10-30 DIAGNOSIS — T839XXA Unspecified complication of genitourinary prosthetic device, implant and graft, initial encounter: Secondary | ICD-10-CM

## 2018-10-30 LAB — WET PREP FOR TRICH, YEAST, CLUE
Trichomonas Exam: NEGATIVE
Yeast Exam: NEGATIVE

## 2018-10-30 MED ORDER — METRONIDAZOLE 500 MG PO TABS: 500.0000 mg | ORAL_TABLET | Freq: Two times a day (BID) | ORAL | 0 refills | Status: DC

## 2018-10-30 MED ORDER — CEFTRIAXONE SODIUM 250 MG IJ SOLR
250.0000 mg | Freq: Once | INTRAMUSCULAR | Status: AC
  Administered 2018-10-30: 250 mg via INTRAMUSCULAR

## 2018-10-30 MED ORDER — DOXYCYCLINE HYCLATE 100 MG PO TABS: 100.0000 mg | ORAL_TABLET | Freq: Two times a day (BID) | ORAL | 0 refills | Status: AC

## 2018-10-30 MED ORDER — DOXYCYCLINE HYCLATE 100 MG PO TABS: 100.0000 mg | ORAL_TABLET | Freq: Two times a day (BID) | ORAL | 0 refills | Status: DC

## 2018-10-30 MED ORDER — METRONIDAZOLE 500 MG PO TABS: 500.0000 mg | ORAL_TABLET | Freq: Two times a day (BID) | ORAL | 0 refills | Status: AC

## 2018-10-30 NOTE — Progress Notes (Signed)
   [place] problem visit  [place] [facility]  Subjective:  Kaylee Hunt is a 33 y.o. being seen today for   Chief Complaint  Patient presents with  . Contraception    IUD check    Client states that she has had 3 yrs of cramping since insertion of Mirena IUD 2017.  States cramping heightened  42yr ago and has become worse in the past couple of months.  States she has had 1 partner x 5 yrs and they broke up April-June , 2020.  She denies other sexual partners during this time and hasn't asked partner if he had other partners during their separation.  She denies fever/ vaginal bleeding/N/V.  She c/o pain with sex-increasing intensity for several mos-, disch and abd pressure.    Does the patient have a current or past history of drug use? No   No components found for: HCV   Health Maintenance Due  Topic Date Due  . TETANUS/TDAP  06/26/2004  . PAP SMEAR-Modifier  06/27/2006  . INFLUENZA VACCINE  09/28/2018    Review of Systems  Constitutional: Negative.   Genitourinary:       Adb pressure, pain with sex, increased vaginal disch  Skin: Negative.     The following portions of the patient's history were reviewed and updated as appropriate: allergies, current medications, past family history, past medical history, past social history, past surgical history and problem list. Problem list updated.   See flowsheet for other program required questions.  Objective:   Vitals:   10/30/18 1610  BP: 116/70  Weight: 139 lb 12.8 oz (63.4 kg)  Height: 5\' 4"  (1.626 m)    Physical Exam Abdominal:     General: Abdomen is flat. There is no distension.     Palpations: Abdomen is soft. There is no mass.     Tenderness: There is abdominal tenderness. There is no guarding or rebound.  Genitourinary:    General: Normal vulva.     Vagina: Vaginal discharge present.     Comments: Large amt of thick yellow disch with odor, pH >4.5, @ IUD string noted, no IUD  part noted. Bimanual exam- uterus firm, smooth, +CMT with bilat adnexal tenderness L>R, No adnexal masses Neurological:     Mental Status: She is alert.    Assessment and Plan:  [name] is a 33 y.o. female presenting to the [facility] Department for a Women's Health problem visit  1. Screening examination for venereal disease  - WET PREP FOR [place], YEAST, CLUE - Chlamydia/Gonorrhea [place] [facility] - HIV [facility] - Syphilis Serology, [facility]  2. Female pelvic inflammation Please take temperature today - cefTRIAXone (ROCEPHIN) injection 250 mg - doxycycline (VIBRA-TABS) 100 MG tablet; Take 1 tablet (100 mg total) by mouth 2 (two) times daily for 14 days.  Dispense: 28 tablet; Refill: 0  -Metronidazole 500 mg take 1 tablet BID x 14 days. Dispense: 14 tablets.  Client received 1 week of Metronidazole--needs 1 more week when returns for F/U visit on 11/01/2018. - Co. To take temperature x 2 days, if elevated contact clinic or go to ED. -Co no sexual activity until she completes treatment. -Partner needs treatment for contact to PID. -Take OTC ibuprofen for pain relief.  3. Complication of intrauterine device (IUD), unspecified complication, initial encounter ([place])     No follow-ups on file.  No future appointments.  Hassell Done, FNP

## 2018-10-30 NOTE — Progress Notes (Signed)
Patient here for IUD check (Mirena, 2017), had placed at [facility] after birth of son. C/O abdominal pain and painful intercourse, states she hasn't checked strings recently.Jenetta Downer, RN

## 2018-10-30 NOTE — Progress Notes (Signed)
Patient wet mount reviewed, patient treated for BV. Patient treated for PID per provider orders. Patient scheduled to come back for follow-up on 11/01/2018, and instructed to come back for re-check when she finishes medication. Patient also given partner card and told to have partner come to clinic to be treated.Marland KitchenMarland KitchenJenetta Downer, RN

## 2018-11-01 ENCOUNTER — Encounter: Payer: Self-pay | Admitting: Advanced Practice Midwife

## 2018-11-01 ENCOUNTER — Ambulatory Visit: Payer: Self-pay

## 2018-11-01 ENCOUNTER — Other Ambulatory Visit: Payer: Self-pay

## 2018-11-01 ENCOUNTER — Ambulatory Visit (LOCAL_COMMUNITY_HEALTH_CENTER): Payer: PRIVATE HEALTH INSURANCE | Admitting: Advanced Practice Midwife

## 2018-11-01 VITALS — BP 112/78 | Temp 97.9°F | Ht 64.0 in | Wt 139.8 lb

## 2018-11-01 DIAGNOSIS — N730 Acute parametritis and pelvic cellulitis: Secondary | ICD-10-CM

## 2018-11-01 DIAGNOSIS — Z3009 Encounter for other general counseling and advice on contraception: Secondary | ICD-10-CM

## 2018-11-01 MED ORDER — METRONIDAZOLE 500 MG PO TABS: 500.0000 mg | ORAL_TABLET | Freq: Two times a day (BID) | ORAL | 0 refills | Status: AC

## 2018-11-01 NOTE — Progress Notes (Signed)
Patient here for PID recheck, saw C. Latta on 10/30/2018. Marland KitchenJenetta Downer, RN

## 2018-11-01 NOTE — Progress Notes (Signed)
Patient given Metronidazole 500 x 7 days at last visit. Per C. Marzetta Board after last visit, patient needs 14 days of Metronidazole in total, so patient given Metronidazole 500mg  x 7 days today to begin after finishing 1st 7 days of medication. Patient states understanding of how to take meds. Patient partner here for treatment today.Jenetta Downer, RN

## 2018-11-01 NOTE — Progress Notes (Signed)
   [place] problem visit  [place] Department  Subjective:  Kaylee Hunt is a 33 y.o. G2P2 nonsmokereing seen today for PID f/u from dx on 10/30/18  Chief Complaint  Patient presents with  . Follow-up    PID recheck    HPI PID dx'd 10/29/18.  Currently taking Metronidazole 500 mg x 14 days and Doxycyline BID x 14 days.  Last sex 10/28/18.  Partner here for tx today  Does the patient have a current or past history of drug use? No   No components found for: HCV]   Health Maintenance Due  Topic Date Due  . PAP SMEAR-Modifier  08/18/2017  . INFLUENZA VACCINE  09/28/2018    ROS  The following portions of the patient's history were reviewed and updated as appropriate: allergies, current medications, past family history, past medical history, past social history, past surgical history and problem list. Problem list updated.   See flowsheet for other program required questions.  Objective:   Vitals:   11/01/18 0907  BP: 112/78  Temp: 97.9 F (36.6 C)  Weight: 139 lb 12.8 oz (63.4 kg)  Height: 5\' 4"  (1.626 m)    Physical Exam Abdominal:     Palpations: Abdomen is soft.     Tenderness: There is no abdominal tenderness.  Genitourinary:    Exam position: Lithotomy position.     Labia:        Right: No rash or lesion.        Left: No rash or lesion.      Vagina: Normal.     Cervix: No cervical motion tenderness or discharge.     Uterus: Normal.      Adnexa: Right adnexa normal and left adnexa normal.       Assessment and Plan:  Kaylee Hunt is a 33 y.o. female presenting to the [facility] Department for a Women's Health problem visit  1. PID (acute pelvic inflammatory disease) Dx 10/30/18 - metroNIDAZOLE (FLAGYL) 500 MG tablet; Take 1 tablet (500 mg total) by mouth 2 (two) times daily for 7 days.  Dispense: 14 tablet; Refill: 0  Given by RN (was only given x7 days on 10/30/18) 2. Family planning Mirena for birth  control     Return for PRN.  No future appointments.  Herbie Saxon, CNM

## 2018-11-05 ENCOUNTER — Telehealth: Payer: Self-pay | Admitting: General Practice

## 2018-11-05 NOTE — Telephone Encounter (Signed)
taking antibotics and experiencing nausea

## 2018-11-05 NOTE — Telephone Encounter (Signed)
Pt started antibiotics Wed night 10/30/2018, was taking it twice a day and was keeping it down. Yesterday morning started having nausea, vomitted up medicine (AM dose), kept down PM dose yesterday. Having nausea today, was able to eat around noon, took medicine and kept it down. Still having nausa this afternoon. Now having burning sensation in vaginal area. Pt counseled that I would consult with provider and call her back.  Consulted with provider, Hassell Done, FNP. Per verbal, pt can stop Metronidazole and continue with Doxycycline with food and plenty of water; also, can eat some yogurt or milk with probiotics; and can use an over the counter yeast cream/suppository.   Called pt back, no answer, left detailed message with provider instructions.

## 2018-11-05 NOTE — Telephone Encounter (Signed)
Phone call to pt. Pt answered phone and given Kaylee Hunt instructions.

## 2018-12-17 ENCOUNTER — Ambulatory Visit: Payer: PRIVATE HEALTH INSURANCE | Admitting: Advanced Practice Midwife

## 2018-12-17 ENCOUNTER — Other Ambulatory Visit: Payer: Self-pay

## 2018-12-17 ENCOUNTER — Other Ambulatory Visit: Payer: PRIVATE HEALTH INSURANCE

## 2018-12-17 DIAGNOSIS — Z113 Encounter for screening for infections with a predominantly sexual mode of transmission: Secondary | ICD-10-CM

## 2018-12-17 LAB — WET PREP FOR TRICH, YEAST, CLUE
Trichomonas Exam: NEGATIVE
Yeast Exam: NEGATIVE

## 2018-12-17 NOTE — Progress Notes (Addendum)
Here today for STD recheck. Patient states she began having vaginal spotting yesterday and noticed a "creamy, mucousy vaginal discharge after urination. Reports last sex 12/12/2018. Hal Morales, RN Wet Prep results reviewed. Per standing orders no treatment indicated. RN counseled patient per provider recommendations to return if symptoms worsens or do not improve to return for a recheck. Hal Morales, RN

## 2018-12-17 NOTE — Progress Notes (Signed)
   [place] problem visit  [place] Department  Subjective:  Kaylee Hunt is a 33 y.o. G2P2  being seen today for f/u PID  No chief complaint on file.   HPI Pt dx'd with PID 10/30/18 and took Metronidazole 500 mg BID x 14 days.  Only took 5 days of Doxycycline BID due to N&V and stopped.  Now c/o pain with sex occasionally relieved with position changes and spotting.  Last sex 12/12/18 without condom. Sex x3 since dx PID.  After urinates c/o creamy mucous d/c.  Does the patient have a current or past history of drug use? No   No components found for: HCV]   Health Maintenance Due  Topic Date Due  . PAP SMEAR-Modifier  08/18/2017  . INFLUENZA VACCINE  09/28/2018    ROS  The following portions of the patient's history were reviewed and updated as appropriate: allergies, current medications, past family history, past medical history, past social history, past surgical history and problem list. Problem list updated.   See flowsheet for other program required questions.  Objective:  There were no vitals filed for this visit.  Physical Exam Constitutional:      Appearance: Normal appearance.  Eyes:     Conjunctiva/sclera: Conjunctivae normal.  Abdominal:     Palpations: Abdomen is soft.     Tenderness: There is no abdominal tenderness.     Comments: Soft good tone without tenderness  Genitourinary:    General: Normal vulva.     Exam position: Lithotomy position.     Labia:        Right: No lesion.        Left: No lesion.      Vagina: Vaginal discharge (thin mucousy light pink leukorrhea, ph unequivocal) present.     Cervix: No cervical motion tenderness or erythema.     Uterus: Normal.      Adnexa: Left adnexa normal.     Rectum: Normal.     Comments: IUD string palpated Lymphadenopathy:     Lower Body: No right inguinal adenopathy. No left inguinal adenopathy.  Skin:    General: Skin is warm and dry.  Neurological:     Mental Status:  She is alert.       Assessment and Plan:  Kaylee Hunt is a 33 y.o. female presenting to the [facility] Department for a Women's Health problem visit  Treat wet mount per standing orders Immunization nurse consult    No follow-ups on file.  No future appointments.  Herbie Saxon, CNM

## 2019-05-08 ENCOUNTER — Encounter: Payer: Self-pay | Admitting: Emergency Medicine

## 2019-05-08 ENCOUNTER — Ambulatory Visit
Admission: EM | Admit: 2019-05-08 | Discharge: 2019-05-08 | Disposition: A | Discharge status: Home / Self Care | Payer: No Typology Code available for payment source | Attending: Urgent Care | Admitting: Urgent Care

## 2019-05-08 ENCOUNTER — Other Ambulatory Visit: Payer: Self-pay

## 2019-05-08 DIAGNOSIS — H6692 Otitis media, unspecified, left ear: Secondary | ICD-10-CM | POA: Diagnosis not present

## 2019-05-08 MED ORDER — AMOXICILLIN-POT CLAVULANATE 875-125 MG PO TABS: 1.0000 | ORAL_TABLET | Freq: Two times a day (BID) | ORAL | 0 refills | Status: AC

## 2019-05-08 MED ORDER — NEOMYCIN-POLYMYXIN-HC 3.5-10000-1 OT SUSP: 4.0000 [drp] | Freq: Three times a day (TID) | OTIC | 0 refills | Status: DC

## 2019-05-08 NOTE — ED Triage Notes (Signed)
Patient c/o ear drainage from her left ear x 1 month. She denies pain but states she feels pressure.

## 2019-05-08 NOTE — Discharge Instructions (Signed)
It was very nice seeing you today in clinic. Thank you for entrusting me with your care.   Keep ears clean and dry. Please utilize the medications that we discussed. Your prescriptions has been called in to your pharmacy. FINISH ALL OF THE MEDICATION AS PRESCRIBED. May use Tylenol and/or Ibuprofen as needed for pain/fever.   Make arrangements to follow up with your regular doctor in 1 week for re-evaluation if not improving. If your symptoms/condition worsens, please seek follow up care either here or in the ER. Please remember, our [place] providers are "right here with you" when you need [place].   Again, it was my pleasure to take care of you today. Thank you for choosing our clinic. I hope that you start to feel better quickly.   Honor Loh, MSN, APRN, FNP-C, CEN Advanced Practice Provider [facility]
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
## 2019-09-25 ENCOUNTER — Other Ambulatory Visit: Payer: Self-pay

## 2019-09-25 ENCOUNTER — Ambulatory Visit: Payer: PRIVATE HEALTH INSURANCE

## 2019-09-25 ENCOUNTER — Ambulatory Visit (LOCAL_COMMUNITY_HEALTH_CENTER): Payer: PRIVATE HEALTH INSURANCE | Admitting: Physician Assistant

## 2019-09-25 ENCOUNTER — Encounter: Payer: Self-pay | Admitting: Physician Assistant

## 2019-09-25 VITALS — BP 117/71 | Ht 63.62 in | Wt 147.4 lb

## 2019-09-25 DIAGNOSIS — Z30432 Encounter for removal of intrauterine contraceptive device: Secondary | ICD-10-CM

## 2019-09-25 DIAGNOSIS — Z3009 Encounter for other general counseling and advice on contraception: Secondary | ICD-10-CM

## 2019-09-25 DIAGNOSIS — F411 Generalized anxiety disorder: Secondary | ICD-10-CM | POA: Insufficient documentation

## 2019-09-25 NOTE — Progress Notes (Signed)
Here today for IUD removal. Last Pap Smear was 08/19/2014, last PE here was 04/22/2015. Mirena IUD was inserted here 05/05/2015. Declines PE and all STD testing today. Declines alternate birth control at this time. "I'm just here to get my IUD out." Hal Morales, RN

## 2019-09-25 NOTE — Progress Notes (Signed)
S: 34 yo woman her requesting IUD removal. States has some cramping and is considering switching to another method after taking a break from IUD. Plans switch of well-woman care to PCP (provider/clinic TBD.) Mirena IUD inserted by [facility] 04/2016. Generally amenorrheic. Declines STI screening or annual well-woman exam.. O: WNWD woman in NAD. Pelvic exam EGBUS WNL, no inguinal LAD. Vagina without discharge, erythema or lesion. IUD strings visible protruding from cervical os. Strings grasped with sterile ring forceps and IUD pulled out of uterus without difficulty. Pt tolerated the procedure well.  A/P: Uncomplicated IUD removal done at pt request. Pt declines a replacement BCM, but is not planning a pregnancy in the next year. Rec ibuprofen 400-600mg  po q 6-8 h prn cramping over next few days. Enc pt to do ROI for [facility] medical records upon establishing primary care.

## 2020-05-13 ENCOUNTER — Other Ambulatory Visit: Payer: Self-pay

## 2020-05-13 ENCOUNTER — Encounter: Payer: Self-pay | Admitting: Internal Medicine

## 2020-05-13 ENCOUNTER — Ambulatory Visit (INDEPENDENT_AMBULATORY_CARE_PROVIDER_SITE_OTHER): Payer: BC Managed Care – PPO | Admitting: Internal Medicine

## 2020-05-13 VITALS — BP 116/78 | HR 80 | Temp 98.2°F | Resp 16 | Ht 63.0 in | Wt 142.0 lb

## 2020-05-13 DIAGNOSIS — Z0000 Encounter for general adult medical examination without abnormal findings: Secondary | ICD-10-CM | POA: Diagnosis not present

## 2020-05-13 DIAGNOSIS — F411 Generalized anxiety disorder: Secondary | ICD-10-CM | POA: Insufficient documentation

## 2020-05-13 DIAGNOSIS — Z124 Encounter for screening for malignant neoplasm of cervix: Secondary | ICD-10-CM | POA: Insufficient documentation

## 2020-05-13 DIAGNOSIS — Z1159 Encounter for screening for other viral diseases: Secondary | ICD-10-CM | POA: Diagnosis not present

## 2020-05-13 DIAGNOSIS — Z0001 Encounter for general adult medical examination with abnormal findings: Secondary | ICD-10-CM | POA: Insufficient documentation

## 2020-05-13 LAB — CBC WITH DIFFERENTIAL/PLATELET
Basophils Absolute: 0 10*3/uL (ref 0.0–0.1)
Basophils Relative: 0.5 % (ref 0.0–3.0)
Eosinophils Absolute: 0 10*3/uL (ref 0.0–0.7)
Eosinophils Relative: 0.5 % (ref 0.0–5.0)
HCT: 41.6 % (ref 36.0–46.0)
Hemoglobin: 14.3 g/dL (ref 12.0–15.0)
Lymphocytes Relative: 22.6 % (ref 12.0–46.0)
Lymphs Abs: 1.8 10*3/uL (ref 0.7–4.0)
MCHC: 34.2 g/dL (ref 30.0–36.0)
MCV: 85.8 fl (ref 78.0–100.0)
Monocytes Absolute: 0.4 10*3/uL (ref 0.1–1.0)
Monocytes Relative: 5.3 % (ref 3.0–12.0)
Neutro Abs: 5.5 10*3/uL (ref 1.4–7.7)
Neutrophils Relative %: 71.1 % (ref 43.0–77.0)
Platelets: 225 10*3/uL (ref 150.0–400.0)
RBC: 4.86 Mil/uL (ref 3.87–5.11)
RDW: 12.8 % (ref 11.5–15.5)
WBC: 7.8 10*3/uL (ref 4.0–10.5)

## 2020-05-13 LAB — HEPATIC FUNCTION PANEL
ALT: 12 U/L (ref 0–35)
AST: 14 U/L (ref 0–37)
Albumin: 4.5 g/dL (ref 3.5–5.2)
Alkaline Phosphatase: 52 U/L (ref 39–117)
Bilirubin, Direct: 0.2 mg/dL (ref 0.0–0.3)
Total Bilirubin: 1 mg/dL (ref 0.2–1.2)
Total Protein: 7.5 g/dL (ref 6.0–8.3)

## 2020-05-13 LAB — BASIC METABOLIC PANEL
BUN: 12 mg/dL (ref 6–23)
CO2: 28 mEq/L (ref 19–32)
Calcium: 9.7 mg/dL (ref 8.4–10.5)
Chloride: 103 mEq/L (ref 96–112)
Creatinine, Ser: 0.66 mg/dL (ref 0.40–1.20)
GFR: 114.08 mL/min (ref >60.00)
Glucose, Bld: 90 mg/dL (ref 70–99)
Potassium: 4 mEq/L (ref 3.5–5.1)
Sodium: 140 mEq/L (ref 135–145)

## 2020-05-13 LAB — LIPID PANEL
Cholesterol: 118 mg/dL (ref 0–200)
HDL: 60.8 mg/dL (ref >39.00)
LDL Cholesterol: 49 mg/dL (ref 0–99)
NonHDL: 57.43
Total CHOL/HDL Ratio: 2
Triglycerides: 40 mg/dL (ref 0.0–149.0)
VLDL: 8 mg/dL (ref 0.0–40.0)

## 2020-05-13 LAB — TSH: TSH: 1.42 u[IU]/mL (ref 0.35–4.50)

## 2020-05-13 MED ORDER — TRAZODONE HCL 50 MG PO TABS: 50.0000 mg | ORAL_TABLET | Freq: Every day | ORAL | 0 refills | Status: DC

## 2020-05-13 NOTE — Patient Instructions (Signed)
Health Maintenance, Female Adopting a healthy lifestyle and getting preventive care are important in promoting health and wellness. Ask your health care provider about:  The right schedule for you to have regular tests and exams.  Things you can do on your own to prevent diseases and keep yourself healthy. What should I know about diet, weight, and exercise? Eat a healthy diet  Eat a diet that includes plenty of vegetables, fruits, low-fat dairy products, and lean protein.  Do not eat a lot of foods that are high in solid fats, added sugars, or sodium.   Maintain a healthy weight Body mass index (BMI) is used to identify weight problems. It estimates body fat based on height and weight. Your health care provider can help determine your BMI and help you achieve or maintain a healthy weight. Get regular exercise Get regular exercise. This is one of the most important things you can do for your health. Most adults should:  Exercise for at least 150 minutes each week. The exercise should increase your heart rate and make you sweat (moderate-intensity exercise).  Do strengthening exercises at least twice a week. This is in addition to the moderate-intensity exercise.  Spend less time sitting. Even light physical activity can be beneficial. Watch cholesterol and blood lipids Have your blood tested for lipids and cholesterol at 35 years of age, then have this test every 5 years. Have your cholesterol levels checked more often if:  Your lipid or cholesterol levels are high.  You are older than 35 years of age.  You are at high risk for heart disease. What should I know about cancer screening? Depending on your health history and family history, you may need to have cancer screening at various ages. This may include screening for:  Breast cancer.  Cervical cancer.  Colorectal cancer.  Skin cancer.  Lung cancer. What should I know about heart disease, diabetes, and high blood  pressure? Blood pressure and heart disease  High blood pressure causes heart disease and increases the risk of stroke. This is more likely to develop in people who have high blood pressure readings, are of African descent, or are overweight.  Have your blood pressure checked: ? Every 3-5 years if you are 18-39 years of age. ? Every year if you are 40 years old or older. Diabetes Have regular diabetes screenings. This checks your fasting blood sugar level. Have the screening done:  Once every three years after age 40 if you are at a normal weight and have a low risk for diabetes.  More often and at a younger age if you are overweight or have a high risk for diabetes. What should I know about preventing infection? Hepatitis B If you have a higher risk for hepatitis B, you should be screened for this virus. Talk with your health care provider to find out if you are at risk for hepatitis B infection. Hepatitis C Testing is recommended for:  Everyone born from 1945 through 1965.  Anyone with known risk factors for hepatitis C. Sexually transmitted infections (STIs)  Get screened for STIs, including gonorrhea and chlamydia, if: ? You are sexually active and are younger than 35 years of age. ? You are older than 35 years of age and your health care provider tells you that you are at risk for this type of infection. ? Your sexual activity has changed since you were last screened, and you are at increased risk for chlamydia or gonorrhea. Ask your health care provider   if you are at risk.  Ask your health care provider about whether you are at high risk for HIV. Your health care provider may recommend a prescription medicine to help prevent HIV infection. If you choose to take medicine to prevent HIV, you should first get tested for HIV. You should then be tested every 3 months for as long as you are taking the medicine. Pregnancy  If you are about to stop having your period (premenopausal) and  you may become pregnant, seek counseling before you get pregnant.  Take 400 to 800 micrograms (mcg) of folic acid every day if you become pregnant.  Ask for birth control (contraception) if you want to prevent pregnancy. Osteoporosis and menopause Osteoporosis is a disease in which the bones lose minerals and strength with aging. This can result in bone fractures. If you are 65 years old or older, or if you are at risk for osteoporosis and fractures, ask your health care provider if you should:  Be screened for bone loss.  Take a calcium or vitamin D supplement to lower your risk of fractures.  Be given hormone replacement therapy (HRT) to treat symptoms of menopause. Follow these instructions at home: Lifestyle  Do not use any products that contain nicotine or tobacco, such as cigarettes, e-cigarettes, and chewing tobacco. If you need help quitting, ask your health care provider.  Do not use street drugs.  Do not share needles.  Ask your health care provider for help if you need support or information about quitting drugs. Alcohol use  Do not drink alcohol if: ? Your health care provider tells you not to drink. ? You are pregnant, may be pregnant, or are planning to become pregnant.  If you drink alcohol: ? Limit how much you use to 0-1 drink a day. ? Limit intake if you are breastfeeding.  Be aware of how much alcohol is in your drink. In the [place] one drink equals one 12 oz bottle of beer (355 mL), one 5 oz glass of wine (148 mL), or one 1 oz glass of hard liquor (44 mL). General instructions  Schedule regular health, dental, and eye exams.  Stay current with your vaccines.  Tell your health care provider if: ? You often feel depressed. ? You have ever been abused or do not feel safe at home. Summary  Adopting a healthy lifestyle and getting preventive care are important in promoting health and wellness.  Follow your health care provider's instructions about healthy  diet, exercising, and getting tested or screened for diseases.  Follow your health care provider's instructions on monitoring your cholesterol and blood pressure. This information is not intended to replace advice given to you by your health care provider. Make sure you discuss any questions you have with your health care provider. Document Revised: 02/06/2018 Document Reviewed: 02/06/2018 Elsevier Patient Education  2021 [employer]  

## 2020-05-13 NOTE — Progress Notes (Signed)
Subjective:  Patient ID: Kaylee Hunt, female    DOB: 26-Apr-1985  Age: 35 y.o. MRN: [id number]  CC: Annual Exam  This visit occurred during the SARS-CoV-2 public health emergency.  Safety protocols were in place, including screening questions prior to the visit, additional usage of staff PPE, and extensive cleaning of exam room while observing appropriate contact time as indicated for disinfecting solutions.    HPI Tianah Lonardo presents for a CPX and to establish.  She has a history of anxiety and depression.  She tells me she has tried multiple SSRIs and buspirone and none of them set well with her.  She had preeclampsia years ago at which time she had multiple abnormal labs.  Other than anxiety, panicky sensation, and insomnia she has felt well recently and offers no other complaints.  History Justiss has a past medical history of History of poor fetal growth (09/10/2014), History of premature delivery (08/19/2014), preeclampsia, prior pregnancy, currently pregnant (09/10/2014), and Hypertension.   She has a past surgical history that includes Cesarean section.   Her family history includes Alcohol abuse in her maternal grandfather; Alzheimer's disease in her paternal grandmother; Anxiety disorder in her father; Cancer in her father, maternal grandfather, and mother; Depression in her brother, father, and mother; Heart murmur in her brother; Post-traumatic stress disorder in her father.She reports that she quit smoking about 11 years ago. Her smoking use included cigarettes. She has never used smokeless tobacco. She reports current alcohol use. She reports previous drug use. Drug: Marijuana.  Outpatient Medications Prior to Visit  Medication Sig Dispense Refill  . busPIRone (BUSPAR) 5 MG tablet Take by mouth.    . neomycin-polymyxin-hydrocortisone (CORTISPORIN) 3.5-10000-1 OTIC suspension Place 4 drops into the left ear 3 (three) times daily. X 5 days 10 mL 0   No facility-administered  medications prior to visit.    ROS Review of Systems  Constitutional: Negative for chills, diaphoresis, fatigue, fever and unexpected weight change.  HENT: Negative.   Eyes: Negative.   Respiratory: Negative.  Negative for cough, chest tightness, shortness of breath and wheezing.   Cardiovascular: Negative for chest pain, palpitations and leg swelling.  Gastrointestinal: Negative for abdominal pain, constipation, diarrhea, nausea and vomiting.  Endocrine: Negative.   Genitourinary: Negative.   Musculoskeletal: Negative.  Negative for arthralgias and myalgias.  Skin: Negative.  Negative for color change and pallor.  Neurological: Negative.  Negative for dizziness and weakness.  Hematological: Negative.  Negative for adenopathy. Does not bruise/bleed easily.  Psychiatric/Behavioral: Positive for dysphoric mood and sleep disturbance. Negative for behavioral problems, confusion, decreased concentration, self-injury and suicidal ideas. The patient is nervous/anxious.     Objective:  BP 116/78   Pulse 80   Temp 98.2 F (36.8 C) (Oral)   Resp 16   Ht 5\' 3"  (1.6 m)   Wt 142 lb (64.4 kg)   LMP 05/06/2020   SpO2 97%   BMI 25.15 kg/m   Physical Exam Vitals reviewed.  Constitutional:      Appearance: Normal appearance.  HENT:     Nose: Nose normal.     Mouth/Throat:     Mouth: Mucous membranes are moist.  Eyes:     General: No scleral icterus.    Conjunctiva/sclera: Conjunctivae normal.  Cardiovascular:     Rate and Rhythm: Normal rate and regular rhythm.     Heart sounds: No murmur heard.   Pulmonary:     Effort: Pulmonary effort is normal.     Breath sounds: No stridor.  No wheezing, rhonchi or rales.  Abdominal:     General: Abdomen is flat.     Palpations: There is no mass.     Tenderness: There is no abdominal tenderness. There is no guarding.  Musculoskeletal:        General: Normal range of motion.     Cervical back: Neck supple.     Right lower leg: No edema.      Left lower leg: No edema.  Lymphadenopathy:     Cervical: No cervical adenopathy.  Skin:    General: Skin is warm and dry.     Coloration: Skin is not pale.  Neurological:     General: No focal deficit present.     Mental Status: She is alert.  Psychiatric:        Attention and Perception: Attention normal. She is attentive.        Mood and Affect: Mood is anxious. Mood is not depressed. Affect is not flat or tearful.        Speech: Speech normal.        Behavior: Behavior normal. Behavior is not agitated, slowed or withdrawn.        Thought Content: Thought content normal.        Cognition and Memory: Cognition normal.        Judgment: Judgment normal.     Lab Results  Component Value Date   WBC 7.8 05/13/2020   HGB 14.3 05/13/2020   HCT 41.6 05/13/2020   PLT 225.0 05/13/2020   GLUCOSE 90 05/13/2020   CHOL 118 05/13/2020   TRIG 40.0 05/13/2020   HDL 60.80 05/13/2020   LDLCALC 49 05/13/2020   ALT 12 05/13/2020   AST 14 05/13/2020   NA 140 05/13/2020   K 4.0 05/13/2020   CL 103 05/13/2020   CREATININE 0.66 05/13/2020   BUN 12 05/13/2020   CO2 28 05/13/2020   TSH 1.42 05/13/2020    Assessment & Plan:   Katee was seen today for annual exam.  Diagnoses and all orders for this visit:  Cervical cancer screening -     Ambulatory referral to Gynecology  GAD (generalized anxiety disorder)- Her labs are reassuring that she does not have an organic illness.  Will start trazodone at 50 mg at bedtime and will increase the dose over time. -     CBC with Differential/Platelet; Future -     Basic metabolic panel; Future -     TSH; Future -     Hepatic function panel; Future -     traZODone (DESYREL) 50 MG tablet; Take 1 tablet (50 mg total) by mouth at bedtime. -     Hepatic function panel -     TSH -     Basic metabolic panel -     CBC with Differential/Platelet  Routine general medical examination at a health care facility- Exam completed, labs reviewed, vaccines  reviewed - She refused a flu vaccine, cancer screenings addressed, patient education was given. -     Lipid panel; Future -     Hepatitis C antibody; Future -     Hepatitis C antibody -     Lipid panel  Need for hepatitis C screening test -     Hepatitis C antibody; Future -     Hepatitis C antibody   I have discontinued Caryl Pina Smith's neomycin-polymyxin-hydrocortisone and busPIRone. I am also having her start on traZODone.  Meds ordered this encounter  Medications  . traZODone (DESYREL) 50 MG  tablet    Sig: Take 1 tablet (50 mg total) by mouth at bedtime.    Dispense:  90 tablet    Refill:  0     Follow-up: Return in about 6 months (around 11/13/2020).  [name], MD

## 2020-05-14 LAB — HEPATITIS C ANTIBODY
Hepatitis C Ab: NONREACTIVE
SIGNAL TO CUT-OFF: 0.05 (ref <1.00)

## 2020-05-27 ENCOUNTER — Other Ambulatory Visit: Payer: Self-pay | Admitting: Internal Medicine

## 2020-05-27 ENCOUNTER — Encounter: Payer: Self-pay | Admitting: Internal Medicine

## 2020-05-28 ENCOUNTER — Other Ambulatory Visit: Payer: Self-pay | Admitting: Internal Medicine

## 2020-05-28 DIAGNOSIS — K5904 Chronic idiopathic constipation: Secondary | ICD-10-CM | POA: Insufficient documentation

## 2020-05-28 MED ORDER — TRULANCE 3 MG PO TABS: 1.0000 | ORAL_TABLET | Freq: Every day | ORAL | 1 refills | Status: DC

## 2020-06-09 ENCOUNTER — Encounter: Payer: Self-pay | Admitting: Nurse Practitioner

## 2020-06-09 ENCOUNTER — Other Ambulatory Visit (HOSPITAL_COMMUNITY)
Admission: RE | Admit: 2020-06-09 | Discharge: 2020-06-09 | Disposition: A | Discharge status: Home / Self Care | Payer: BC Managed Care – PPO | Source: Ambulatory Visit | Attending: Nurse Practitioner | Admitting: Nurse Practitioner

## 2020-06-09 ENCOUNTER — Ambulatory Visit (INDEPENDENT_AMBULATORY_CARE_PROVIDER_SITE_OTHER): Payer: BC Managed Care – PPO | Admitting: Nurse Practitioner

## 2020-06-09 ENCOUNTER — Other Ambulatory Visit: Payer: Self-pay

## 2020-06-09 VITALS — BP 122/76 | Ht 64.0 in | Wt 142.0 lb

## 2020-06-09 DIAGNOSIS — R87613 High grade squamous intraepithelial lesion on cytologic smear of cervix (HGSIL): Secondary | ICD-10-CM | POA: Diagnosis not present

## 2020-06-09 DIAGNOSIS — Z113 Encounter for screening for infections with a predominantly sexual mode of transmission: Secondary | ICD-10-CM | POA: Diagnosis not present

## 2020-06-09 DIAGNOSIS — Z3009 Encounter for other general counseling and advice on contraception: Secondary | ICD-10-CM

## 2020-06-09 DIAGNOSIS — Z803 Family history of malignant neoplasm of breast: Secondary | ICD-10-CM | POA: Diagnosis not present

## 2020-06-09 DIAGNOSIS — Z01419 Encounter for gynecological examination (general) (routine) without abnormal findings: Secondary | ICD-10-CM | POA: Diagnosis not present

## 2020-06-09 LAB — HM PAP SMEAR

## 2020-06-09 NOTE — Progress Notes (Signed)
Kaylee Hunt 10-05-1985 [id number]   History:  35 y.o. [id number] presents as new patient to establish care and discuss contraception. Monthly cycles. IUD removed 08/2019 due to not "feeling right". Normal pap history. Anxiety/depression managed by PCP. Sexually active 4 days ago, no contraception. Mother diagnosed with stage 4 breast cancer at age 60. History of fibroadenoma.   Gynecologic History Patient's last menstrual period was 05/28/2020. Period Cycle (Days): 28 Period Duration (Days): 5 Period Pattern: Regular Menstrual Flow: Moderate Dysmenorrhea: None Contraception/Family planning: none  Health Maintenance Last Pap: 2017. Results were: normal Last mammogram: 7-8 years ago Last colonoscopy: N/A Last Dexa: N/A  Past medical history, past surgical history, family history and social history were all reviewed and documented in the EPIC chart. Single. 68 and 60 yo children. Works at [employer]:  A ROS was performed and pertinent positives and negatives are included.  Exam:  Vitals:   06/09/20 0818  BP: 122/76  Weight: 142 lb (64.4 kg)  Height: 5\' 4"  (1.626 m)   Body mass index is 24.37 kg/m.  General appearance:  Normal Thyroid:  Symmetrical, normal in size, without palpable masses or nodularity. Respiratory  Auscultation:  Clear without wheezing or rhonchi Cardiovascular  Auscultation:  Regular rate, without rubs, murmurs or gallops  Edema/varicosities:  Not grossly evident Abdominal  Soft,nontender, without masses, guarding or rebound.  Liver/spleen:  No organomegaly noted  Hernia:  None appreciated  Skin  Inspection:  Grossly normal   Breasts: Examined lying and sitting.   Right: Without masses, retractions, discharge or axillary adenopathy.   Left: Without masses, retractions, discharge or axillary adenopathy. Gentitourinary   Inguinal/mons:  Normal without inguinal adenopathy  External genitalia:  Normal  BUS/Urethra/Skene's glands:  Normal  Vagina:   Normal  Cervix:  Normal  Uterus:  Normal in size, shape and contour.  Midline and mobile  Adnexa/parametria:     Rt: Without masses or tenderness.   Lt: Without masses or tenderness.  Anus and perineum: Normal  Assessment/Plan:  35 y.o. [id number] to establish care.   Well female exam with routine gynecological exam - Plan: Cytology - PAP( [place]). Education provided on SBEs, importance of preventative screenings, current guidelines, high calcium diet, regular exercise, and multivitamin daily. Labs with PCP.   Encounter for other general counseling and advice on contraception - Has tried IUD in the past but did not "feel right" so it was removed 08/2019. Contraceptive options were reviewed, including hormonal methods, both combination (pill, patch, vaginal ring) and progesterone-only (pill, Depo Provera and Nexplanon), intrauterine devices (Mirena, [place], [place], and [place]), barrier methods (condoms, diaphragm) and female/female sterilization. The mechanisms, risks, benefits and side effects of all methods were discussed. She would like Depo Provera. She will return when menses start as she was sexually active 4 days ago without contraception. Strict condom use until then. All questions answered.   Screen for STD (sexually transmitted disease) - Plan: HIV Antibody (routine testing w rflx), RPR, Cytology - PAP( [place]) - Gonorrhea, chlamydia, trich pending.   Family history of breast cancer in first degree relative - Mother diagnosed with breast cancer at age 69. Discussed importance of annual screenings. She did not have insurance prior and plans to get these screenings done soon. Information provided on [facility]. Also discussed genetic testing and she will think about it. Normal breast exam today.   Screening for cervical cancer - Normal Pap history.  Pap with HR HPV today.   Return in 2 weeks  if menses start for Depo Provera administration. Return in 1 year for annual visit.      Tamela Gammon DNP, 8:47 AM 06/09/2020

## 2020-06-09 NOTE — Patient Instructions (Addendum)
Schedule mammogram! [facility] [phone] [address] [place], [place] [address]  Health Maintenance, Female Adopting a healthy lifestyle and getting preventive care are important in promoting health and wellness. Ask your health care provider about:  The right schedule for you to have regular tests and exams.  Things you can do on your own to prevent diseases and keep yourself healthy. What should I know about diet, weight, and exercise? Eat a healthy diet  Eat a diet that includes plenty of vegetables, fruits, low-fat dairy products, and lean protein.  Do not eat a lot of foods that are high in solid fats, added sugars, or sodium.   Maintain a healthy weight Body mass index (BMI) is used to identify weight problems. It estimates body fat based on height and weight. Your health care provider can help determine your BMI and help you achieve or maintain a healthy weight. Get regular exercise Get regular exercise. This is one of the most important things you can do for your health. Most adults should:  Exercise for at least 150 minutes each week. The exercise should increase your heart rate and make you sweat (moderate-intensity exercise).  Do strengthening exercises at least twice a week. This is in addition to the moderate-intensity exercise.  Spend less time sitting. Even light physical activity can be beneficial. Watch cholesterol and blood lipids Have your blood tested for lipids and cholesterol at 35 years of age, then have this test every 5 years. Have your cholesterol levels checked more often if:  Your lipid or cholesterol levels are high.  You are older than 35 years of age.  You are at high risk for heart disease. What should I know about cancer screening? Depending on your health history and family history, you may need to have cancer screening at various ages. This may include screening for:  Breast cancer.  Cervical  cancer.  Colorectal cancer.  Skin cancer.  Lung cancer. What should I know about heart disease, diabetes, and high blood pressure? Blood pressure and heart disease  High blood pressure causes heart disease and increases the risk of stroke. This is more likely to develop in people who have high blood pressure readings, are of African descent, or are overweight.  Have your blood pressure checked: ? Every 3-5 years if you are 74-58 years of age. ? Every year if you are 58 years old or older. Diabetes Have regular diabetes screenings. This checks your fasting blood sugar level. Have the screening done:  Once every three years after age 9 if you are at a normal weight and have a low risk for diabetes.  More often and at a younger age if you are overweight or have a high risk for diabetes. What should I know about preventing infection? Hepatitis B If you have a higher risk for hepatitis B, you should be screened for this virus. Talk with your health care provider to find out if you are at risk for hepatitis B infection. Hepatitis C Testing is recommended for:  Everyone born from [birth year] through 1965.  Anyone with known risk factors for hepatitis C. Sexually transmitted infections (STIs)  Get screened for STIs, including gonorrhea and chlamydia, if: ? You are sexually active and are younger than 35 years of age. ? You are older than 35 years of age and your health care provider tells you that you are at risk for this type of infection. ? Your sexual activity has changed since  you were last screened, and you are at increased risk for chlamydia or gonorrhea. Ask your health care provider if you are at risk.  Ask your health care provider about whether you are at high risk for HIV. Your health care provider may recommend a prescription medicine to help prevent HIV infection. If you choose to take medicine to prevent HIV, you should first get tested for HIV. You should then be tested every 3  months for as long as you are taking the medicine. Pregnancy  If you are about to stop having your period (premenopausal) and you may become pregnant, seek counseling before you get pregnant.  Take 400 to 800 micrograms (mcg) of folic acid every day if you become pregnant.  Ask for birth control (contraception) if you want to prevent pregnancy. Osteoporosis and menopause Osteoporosis is a disease in which the bones lose minerals and strength with aging. This can result in bone fractures. If you are 108 years old or older, or if you are at risk for osteoporosis and fractures, ask your health care provider if you should:  Be screened for bone loss.  Take a calcium or vitamin D supplement to lower your risk of fractures.  Be given hormone replacement therapy (HRT) to treat symptoms of menopause. Follow these instructions at home: Lifestyle  Do not use any products that contain nicotine or tobacco, such as cigarettes, e-cigarettes, and chewing tobacco. If you need help quitting, ask your health care provider.  Do not use street drugs.  Do not share needles.  Ask your health care provider for help if you need support or information about quitting drugs. Alcohol use  Do not drink alcohol if: ? Your health care provider tells you not to drink. ? You are pregnant, may be pregnant, or are planning to become pregnant.  If you drink alcohol: ? Limit how much you use to 0-1 drink a day. ? Limit intake if you are breastfeeding.  Be aware of how much alcohol is in your drink. In the [place] one drink equals one 12 oz bottle of beer (355 mL), one 5 oz glass of wine (148 mL), or one 1 oz glass of hard liquor (44 mL). General instructions  Schedule regular health, dental, and eye exams.  Stay current with your vaccines.  Tell your health care provider if: ? You often feel depressed. ? You have ever been abused or do not feel safe at home. Summary  Adopting a healthy lifestyle and getting  preventive care are important in promoting health and wellness.  Follow your health care provider's instructions about healthy diet, exercising, and getting tested or screened for diseases.  Follow your health care provider's instructions on monitoring your cholesterol and blood pressure. This information is not intended to replace advice given to you by your health care provider. Make sure you discuss any questions you have with your health care provider. Document Revised: 02/06/2018 Document Reviewed: 02/06/2018 Elsevier Patient Education  2021 [employer]. Medroxyprogesterone injection [Contraceptive] What is this medicine? MEDROXYPROGESTERONE (me DROX ee proe JES te rone) contraceptive injections prevent pregnancy. They provide effective birth control for 3 months. Depo-SubQ Provera 104 injection is also used for treating pain related to endometriosis. This medicine may be used for other purposes; ask your health care provider or pharmacist if you have questions. COMMON BRAND NAME(S): Depo-Provera, Depo-subQ Provera 104 What should I tell my health care provider before I take this medicine? They need to know if you have any of these  conditions:  asthma  blood clots  breast cancer or family history of breast cancer  depression  diabetes  eating disorder (anorexia nervosa)  heart attack  high blood pressure  HIV infection or AIDS  if you often drink alcohol  kidney disease  liver disease  migraine headaches  osteoporosis, weak bones  seizures  stroke  tobacco smoker  vaginal bleeding  an unusual or allergic reaction to medroxyprogesterone, other hormones, medicines, foods, dyes, or preservatives  pregnant or trying to get pregnant  breast-feeding How should I use this medicine? Depo-Provera CI contraceptive injection is given into a muscle. Depo-subQ Provera 104 injection is given under the skin. It is given by a health care provider in a hospital or  clinic setting. The injection is usually given during the first 5 days after the start of a menstrual period or 6 weeks after delivery of a baby. A patient package insert for the product will be given with each prescription and refill. Be sure to read this information carefully each time. The sheet may change often. Talk to your pediatrician regarding the use of this medicine in children. Special care may be needed. These injections have been used in female children who have started having menstrual periods. Overdosage: If you think you have taken too much of this medicine contact a poison control center or emergency room at once. NOTE: This medicine is only for you. Do not share this medicine with others. What if I miss a dose? Keep appointments for follow-up doses. You must get an injection once every 3 months. It is important not to miss your dose. Call your health care provider if you are unable to keep an appointment. What may interact with this medicine?  antibiotics or medicines for infections, especially rifampin and griseofulvin  antivirals for HIV or hepatitis  aprepitant  armodafinil  bexarotene  bosentan  medicines for seizures like carbamazepine, felbamate, oxcarbazepine, phenytoin, phenobarbital, primidone, topiramate  mitotane  modafinil  St. John's wort This list may not describe all possible interactions. Give your health care provider a list of all the medicines, herbs, non-prescription drugs, or dietary supplements you use. Also tell them if you smoke, drink alcohol, or use illegal drugs. Some items may interact with your medicine. What should I watch for while using this medicine? This drug does not protect you against HIV infection (AIDS) or other sexually transmitted diseases. Use of this product may cause you to lose calcium from your bones. Loss of calcium may cause weak bones (osteoporosis). Only use this product for more than 2 years if other forms of birth  control are not right for you. The longer you use this product for birth control the more likely you will be at risk for weak bones. Ask your health care professional how you can keep strong bones. You may have a change in bleeding pattern or irregular periods. Many females stop having periods while taking this drug. If you have received your injections on time, your chance of being pregnant is very low. If you think you may be pregnant, see your health care professional as soon as possible. Tell your health care professional if you want to get pregnant within the next year. The effect of this medicine may last a long time after you get your last injection. What side effects may I notice from receiving this medicine? Side effects that you should report to your doctor or health care professional as soon as possible:  allergic reactions like skin rash, itching  or hives, swelling of the face, lips, or tongue  blood clot (chest pain; shortness of breath; pain, swelling, or warmth in the leg)  breast tenderness or discharge  changes in emotions or moods  changes in vision  liver injury (dark yellow or brown urine; general ill feeling or flu-like symptoms; loss of appetite, right upper belly pain; unusually weak or tired, yellowing of the eyes or skin)  persistent pain, pus, or bleeding at the injection site  stroke (changes in vision; confusion; trouble speaking or understanding; severe headaches; sudden numbness or weakness of the face, arm or leg; trouble walking; dizziness; loss of balance or coordination)  trouble breathing Side effects that usually do not require medical attention (report to your doctor or health care professional if they continue or are bothersome):  change in sex drive  dizziness  fluid retention  headache  irregular periods, spotting, or absent periods  pain, redness, or irritation at site where injected  stomach pain  weight gain This list may not  describe all possible side effects. Call your doctor for medical advice about side effects. You may report side effects to FDA at 1-800-FDA-1088. Where should I keep my medicine? This injection is only given by a health care provider. It will not be stored at home. NOTE: This sheet is a summary. It may not cover all possible information. If you have questions about this medicine, talk to your doctor, pharmacist, or health care provider.  2021 Elsevier/Gold Standard (2019-04-02 10:29:21)

## 2020-06-10 LAB — RPR: RPR Ser Ql: NONREACTIVE

## 2020-06-10 LAB — HIV ANTIBODY (ROUTINE TESTING W REFLEX): HIV 1&2 Ab, 4th Generation: NONREACTIVE

## 2020-06-11 LAB — CYTOLOGY - PAP
Chlamydia: NEGATIVE
Comment: NEGATIVE
Comment: NEGATIVE
Comment: NEGATIVE
Comment: NORMAL
Diagnosis: HIGH — AB
High risk HPV: POSITIVE — AB
Neisseria Gonorrhea: NEGATIVE
Trichomonas: NEGATIVE

## 2020-06-16 ENCOUNTER — Encounter: Payer: Self-pay | Admitting: Nurse Practitioner

## 2020-06-16 NOTE — Telephone Encounter (Signed)
Can you schedule patient for C&B. Thanks

## 2020-06-16 NOTE — Telephone Encounter (Signed)
Appointment scheduled for May 2 with Dr Quincy Simmonds.

## 2020-06-24 NOTE — Progress Notes (Signed)
  Subjective:     Patient ID: Kirin Brandenburger, female   DOB: 1985/03/18, 35 y.o.   MRN: [id number]  HPI  Patient here today for colposcopy with pap 06-09-20 HSIL:Pos HR HPV.  Last pap 2017 normal per patient   Review of Systems  All other systems reviewed and are negative.   LMP: 06-25-20 light/dark Contraception: None UPT: Neg  Gardasil vaccine:  Had first vaccine in her 11s.  Did not complete the series.      Objective:   Physical Exam Genitourinary:     Colposcopy - cervix, vagina. Consent for procedure.  3% acetic acid used in vagina. White light and green light filter used.  Colposcopy satisfactory:  Yes   __x___          No    _____ Findings:    Mild amount of vaginal flow.  Cervix:  Acetowhite lesions seen extending into the cervical canal at 12:00.  Limits of lesion are seen. Vagina:  No lesions are noted.  Biopsies:  ECC and 12:00 transformation zone.  Monsel's placed.  Silver nitrate also used.  Minimal EBL. No complications.      Assessment:     HGSIL pap and positive HR HPV.     Plan:     FU biopsies results.  Post colposcopy instructions given.  Will start Gardasil vaccine.  She is also due to start Depo Provera also.

## 2020-06-28 ENCOUNTER — Other Ambulatory Visit (HOSPITAL_COMMUNITY)
Admission: RE | Admit: 2020-06-28 | Discharge: 2020-06-28 | Disposition: A | Discharge status: Home / Self Care | Payer: BC Managed Care – PPO | Source: Ambulatory Visit | Attending: Obstetrics and Gynecology | Admitting: Obstetrics and Gynecology

## 2020-06-28 ENCOUNTER — Encounter: Payer: Self-pay | Admitting: Obstetrics and Gynecology

## 2020-06-28 ENCOUNTER — Ambulatory Visit (INDEPENDENT_AMBULATORY_CARE_PROVIDER_SITE_OTHER): Payer: BC Managed Care – PPO | Admitting: Obstetrics and Gynecology

## 2020-06-28 ENCOUNTER — Other Ambulatory Visit: Payer: Self-pay

## 2020-06-28 VITALS — BP 140/86 | HR 99 | Ht 64.0 in | Wt 146.0 lb

## 2020-06-28 DIAGNOSIS — Z01812 Encounter for preprocedural laboratory examination: Secondary | ICD-10-CM

## 2020-06-28 DIAGNOSIS — R87810 Cervical high risk human papillomavirus (HPV) DNA test positive: Secondary | ICD-10-CM | POA: Insufficient documentation

## 2020-06-28 DIAGNOSIS — Z30013 Encounter for initial prescription of injectable contraceptive: Secondary | ICD-10-CM | POA: Diagnosis not present

## 2020-06-28 DIAGNOSIS — N871 Moderate cervical dysplasia: Secondary | ICD-10-CM

## 2020-06-28 DIAGNOSIS — Z23 Encounter for immunization: Secondary | ICD-10-CM

## 2020-06-28 DIAGNOSIS — R87613 High grade squamous intraepithelial lesion on cytologic smear of cervix (HGSIL): Secondary | ICD-10-CM

## 2020-06-28 LAB — PREGNANCY, URINE: Preg Test, Ur: NEGATIVE

## 2020-06-28 MED ORDER — MEDROXYPROGESTERONE ACETATE 150 MG/ML IM SUSP
150.0000 mg | Freq: Once | INTRAMUSCULAR | Status: AC
  Administered 2020-06-28: 150 mg via INTRAMUSCULAR

## 2020-06-28 NOTE — Patient Instructions (Signed)
HPV (Human Papillomavirus) Vaccine: What You Need to Know 1. Why get vaccinated? HPV (human papillomavirus) vaccine can prevent infection with some types of human papillomavirus. HPV infections can cause certain types of cancers, including:  cervical, vaginal, and vulvar cancers in women  penile cancer in men  anal cancers in both men and women  cancers of tonsils, base of tongue, and back of throat (oropharyngeal cancer) in both men and women HPV infections can also cause anogenital warts. HPV vaccine can prevent over 90% of cancers caused by HPV. HPV is spread through intimate skin-to-skin or sexual contact. HPV infections are so common that nearly all people will get at least one type of HPV at some time in their lives. Most HPV infections go away on their own within 2 years. But sometimes HPV infections will last longer and can cause cancers later in life. 2. HPV vaccine HPV vaccine is routinely recommended for adolescents at 86 or 35 years of age to ensure they are protected before they are exposed to the virus. HPV vaccine may be given beginning at age 63 years and vaccination is recommended for everyone through 35 years of age. HPV vaccine may be given to adults 59 through 35 years of age, based on discussions between the patient and health care provider. Most children who get the first dose before 82 years of age need 2 doses of HPV vaccine. People who get the first dose at or after 74 years of age and younger people with certain immunocompromising conditions need 3 doses. Your health care provider can give you more information. HPV vaccine may be given at the same time as other vaccines. 3. Talk with your health care provider Tell your vaccination provider if the person getting the vaccine:  Has had an allergic reaction after a previous dose of HPV vaccine, or has any severe, life-threatening allergies  Is pregnant--HPV vaccine is not recommended until after pregnancy In some cases,  your health care provider may decide to postpone HPV vaccination until a future visit. People with minor illnesses, such as a cold, may be vaccinated. People who are moderately or severely ill should usually wait until they recover before getting HPV vaccine. Your health care provider can give you more information. 4. Risks of a vaccine reaction  Soreness, redness, or swelling where the shot is given can happen after HPV vaccination.  Fever or headache can happen after HPV vaccination. People sometimes faint after medical procedures, including vaccination. Tell your provider if you feel dizzy or have vision changes or ringing in the ears. As with any medicine, there is a very remote chance of a vaccine causing a severe allergic reaction, other serious injury, or death. 5. What if there is a serious problem? An allergic reaction could occur after the vaccinated person leaves the clinic. If you see signs of a severe allergic reaction (hives, swelling of the face and throat, difficulty breathing, a fast heartbeat, dizziness, or weakness), call 9-1-1 and get the person to the nearest hospital. For other signs that concern you, call your health care provider. Adverse reactions should be reported to the Vaccine Adverse Event Reporting System (VAERS). Your health care provider will usually file this report, or you can do it yourself. Visit the VAERS website at www.vaers.SamedayNews.es or call 724-079-0441. VAERS is only for reporting reactions, and VAERS staff members do not give medical advice. 6. The National Vaccine Injury Compensation Program The [employer] Vaccine Injury Compensation Program (VICP) is a Technical brewer that was created  to compensate people who may have been injured by certain vaccines. Claims regarding alleged injury or death due to vaccination have a time limit for filing, which may be as short as two years. Visit the VICP website at GoldCloset.com.ee or call 512-394-5567 to  learn about the program and about filing a claim. 7. How can I learn more?  Ask your health care provider.  Call your local or state health department.  Visit the website of the Food and Drug Administration (FDA) for vaccine package inserts and additional information at TraderRating.uy.  Contact the Centers for Disease Control and Prevention (CDC): ? Call 726-725-0603 (1-800-CDC-INFO) or ? Visit CDC's website at http://hunter.com/. Vaccine Information Statement HPV Vaccine (10/03/2019) This information is not intended to replace advice given to you by your health care provider. Make sure you discuss any questions you have with your health care provider. Document Revised: 11/11/2019 Document Reviewed: 11/11/2019 Elsevier Patient Education  2021 [employer]. Medroxyprogesterone injection [Contraceptive] What is this medicine? MEDROXYPROGESTERONE (me DROX ee proe JES te rone) contraceptive injections prevent pregnancy. They provide effective birth control for 3 months. Depo-SubQ Provera 104 injection is also used for treating pain related to endometriosis. This medicine may be used for other purposes; ask your health care provider or pharmacist if you have questions. COMMON BRAND NAME(S): Depo-Provera, Depo-subQ Provera 104 What should I tell my health care provider before I take this medicine? They need to know if you have any of these conditions:  asthma  blood clots  breast cancer or family history of breast cancer  depression  diabetes  eating disorder (anorexia nervosa)  heart attack  high blood pressure  HIV infection or AIDS  if you often drink alcohol  kidney disease  liver disease  migraine headaches  osteoporosis, weak bones  seizures  stroke  tobacco smoker  vaginal bleeding  an unusual or allergic reaction to medroxyprogesterone, other hormones, medicines, foods, dyes, or preservatives  pregnant or trying to get  pregnant  breast-feeding How should I use this medicine? Depo-Provera CI contraceptive injection is given into a muscle. Depo-subQ Provera 104 injection is given under the skin. It is given by a health care provider in a hospital or clinic setting. The injection is usually given during the first 5 days after the start of a menstrual period or 6 weeks after delivery of a baby. A patient package insert for the product will be given with each prescription and refill. Be sure to read this information carefully each time. The sheet may change often. Talk to your pediatrician regarding the use of this medicine in children. Special care may be needed. These injections have been used in female children who have started having menstrual periods. Overdosage: If you think you have taken too much of this medicine contact a poison control center or emergency room at once. NOTE: This medicine is only for you. Do not share this medicine with others. What if I miss a dose? Keep appointments for follow-up doses. You must get an injection once every 3 months. It is important not to miss your dose. Call your health care provider if you are unable to keep an appointment. What may interact with this medicine?  antibiotics or medicines for infections, especially rifampin and griseofulvin  antivirals for HIV or hepatitis  aprepitant  armodafinil  bexarotene  bosentan  medicines for seizures like carbamazepine, felbamate, oxcarbazepine, phenytoin, phenobarbital, primidone, topiramate  mitotane  modafinil  St. John's wort This list may not describe all possible interactions.  Give your health care provider a list of all the medicines, herbs, non-prescription drugs, or dietary supplements you use. Also tell them if you smoke, drink alcohol, or use illegal drugs. Some items may interact with your medicine. What should I watch for while using this medicine? This drug does not protect you against HIV infection  (AIDS) or other sexually transmitted diseases. Use of this product may cause you to lose calcium from your bones. Loss of calcium may cause weak bones (osteoporosis). Only use this product for more than 2 years if other forms of birth control are not right for you. The longer you use this product for birth control the more likely you will be at risk for weak bones. Ask your health care professional how you can keep strong bones. You may have a change in bleeding pattern or irregular periods. Many females stop having periods while taking this drug. If you have received your injections on time, your chance of being pregnant is very low. If you think you may be pregnant, see your health care professional as soon as possible. Tell your health care professional if you want to get pregnant within the next year. The effect of this medicine may last a long time after you get your last injection. What side effects may I notice from receiving this medicine? Side effects that you should report to your doctor or health care professional as soon as possible:  allergic reactions like skin rash, itching or hives, swelling of the face, lips, or tongue  blood clot (chest pain; shortness of breath; pain, swelling, or warmth in the leg)  breast tenderness or discharge  changes in emotions or moods  changes in vision  liver injury (dark yellow or brown urine; general ill feeling or flu-like symptoms; loss of appetite, right upper belly pain; unusually weak or tired, yellowing of the eyes or skin)  persistent pain, pus, or bleeding at the injection site  stroke (changes in vision; confusion; trouble speaking or understanding; severe headaches; sudden numbness or weakness of the face, arm or leg; trouble walking; dizziness; loss of balance or coordination)  trouble breathing Side effects that usually do not require medical attention (report to your doctor or health care professional if they continue or are  bothersome):  change in sex drive  dizziness  fluid retention  headache  irregular periods, spotting, or absent periods  pain, redness, or irritation at site where injected  stomach pain  weight gain This list may not describe all possible side effects. Call your doctor for medical advice about side effects. You may report side effects to FDA at 1-800-FDA-1088. Where should I keep my medicine? This injection is only given by a health care provider. It will not be stored at home. NOTE: This sheet is a summary. It may not cover all possible information. If you have questions about this medicine, talk to your doctor, pharmacist, or health care provider.  2021 Elsevier/Gold Standard (2019-04-02 10:29:21) Colposcopy, Care After This sheet gives you information about how to care for yourself after your procedure. Your doctor may also give you more specific instructions. If you have problems or questions, contact your doctor. What can I expect after the procedure? If you did not have a sample of your tissue taken out (did not have a biopsy), you may only have some spotting of blood for a few days. You can go back to your normal activities. If you had a sample of your tissue taken out, it is common  to have:  Soreness and mild pain. These may last for a few days.  A light-headed feeling.  Mild bleeding or fluid (discharge) coming from your vagina. The fluid will look dark and grainy. You may have this for a few days. The fluid may be caused by a liquid that was used during your procedure. You may need to wear a sanitary pad.  Spotting of blood for at least 48 hours after the procedure. Follow these instructions at home: Medicines  Take over-the-counter and prescription medicines only as told by your doctor.  Ask your doctor what medicines you can start taking again. This is very important if you take blood thinners. Activity  Limit your activity for the first day after your  procedure as told by your doctor.  For at least 3 days, or for as long as told by your doctor, avoid: ? Douching. ? Using tampons. ? Having sex.  Return to your normal activities as told by your doctor. Ask your doctor what activities are safe for you. General instructions  Drink enough fluid to keep your pee (urine) pale yellow.  Ask your doctor if you may take baths, swim, or use a hot tub. You may take showers.  If you use birth control (contraception), keep using it.  Keep all follow-up visits as told by your doctor. This is important.   Contact a doctor if:  You get a skin rash. Get help right away if:  You bleed a lot from your vagina. A lot of bleeding means you use more than one pad an hour for 2 hours in a row.  You have clumps of blood (blood clots) coming from your vagina.  You have a fever or chills.  You have signs of infection. This may be fluid coming from your vagina that is: ? Different than normal. ? Yellow. ? Bad-smelling.  You have very bad pain or cramps in your lower belly that do not get better with medicine.  You faint. Summary  If you did not have a sample of your tissue taken out, you may only have some spotting of blood for a few days. You can go back to your normal activities.  If you had a sample of your tissue taken out, it is common to have mild pain for a few days and spotting for 48 hours.  Avoid douching, using tampons, and having sex for at least 3 days after the procedure or for as long as told.  Get help right away if you have a lot of bleeding, very bad pain, or signs of infection. This information is not intended to replace advice given to you by your health care provider. Make sure you discuss any questions you have with your health care provider. Document Revised: 12/16/2019 Document Reviewed: 02/12/2019 Elsevier Patient Education  2021 [employer].

## 2020-06-30 LAB — SURGICAL PATHOLOGY

## 2020-07-02 ENCOUNTER — Other Ambulatory Visit: Payer: Self-pay | Admitting: *Deleted

## 2020-07-02 ENCOUNTER — Ambulatory Visit: Payer: BC Managed Care – PPO

## 2020-07-02 DIAGNOSIS — N871 Moderate cervical dysplasia: Secondary | ICD-10-CM
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
## 2020-07-13 ENCOUNTER — Ambulatory Visit (INDEPENDENT_AMBULATORY_CARE_PROVIDER_SITE_OTHER): Payer: BC Managed Care – PPO | Admitting: Internal Medicine

## 2020-07-13 ENCOUNTER — Encounter: Payer: Self-pay | Admitting: Internal Medicine

## 2020-07-13 ENCOUNTER — Other Ambulatory Visit: Payer: Self-pay

## 2020-07-13 DIAGNOSIS — J039 Acute tonsillitis, unspecified: Secondary | ICD-10-CM | POA: Diagnosis not present

## 2020-07-13 DIAGNOSIS — J020 Streptococcal pharyngitis: Secondary | ICD-10-CM

## 2020-07-13 LAB — POCT RAPID STREP A (OFFICE): Rapid Strep A Screen: NEGATIVE

## 2020-07-13 MED ORDER — AMOXICILLIN-POT CLAVULANATE 875-125 MG PO TABS: 1.0000 | ORAL_TABLET | Freq: Two times a day (BID) | ORAL | 0 refills | Status: DC

## 2020-07-13 NOTE — Patient Instructions (Signed)
Take the antibiotic as prescribed.  Take Advil as needed for your pain.  Drink plenty of fluids and rest.     Please call if there is no improvement in your symptoms.

## 2020-07-13 NOTE — Assessment & Plan Note (Addendum)
Acute Rapid strep test negative Concern for bacterial cause and given the appearance of her right tonsil and will start antibiotics- Augmentin 875-125 mg twice daily x10 days Continue ibuprofen as needed Rest, fluids Note given for work Call if no improvement

## 2020-07-28 ENCOUNTER — Other Ambulatory Visit: Payer: Self-pay | Admitting: Internal Medicine

## 2020-07-28 ENCOUNTER — Encounter: Payer: Self-pay | Admitting: Internal Medicine

## 2020-07-28 DIAGNOSIS — F411 Generalized anxiety disorder: Secondary | ICD-10-CM

## 2020-07-28 MED ORDER — TRAZODONE HCL 50 MG PO TABS: 150.0000 mg | ORAL_TABLET | Freq: Every day | ORAL | 1 refills | Status: DC

## 2020-07-28 MED ORDER — TRAZODONE HCL 50 MG PO TABS: 50.0000 mg | ORAL_TABLET | Freq: Every day | ORAL | 0 refills | Status: DC

## 2020-08-02 NOTE — Progress Notes (Signed)
  Subjective:     Patient ID: Kaylee Hunt, female   DOB: 16-May-1985, 35 y.o.   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  Depo Provera for contraception.   Review of Systems  See HPI.       Objective:   Physical Exam    Consent for colposcopy with LEEP conization.  Insulated speculum placed.  Colposcopy performed using 3% acetic acid.  Colposcopy satisfactory and shows evidence of prior biopsy at 12:00.   Mild acetowhite lesion noted at 12:00 location.  No vaginal lesions noted. Lugol's placed  Local 1% lidocaine with epi 1:100,000 - 20 cc, lot [id number], expiration 10/28/20.  circumferentially to cervix.  Patient grounded.  Exocervix pass with loop and setting of 50 watts cutting.  Specimen pinned at 12:00.  Endocervix pass with smaller loop for superior and inferior cervix, which were done as two pieces using 50 watts cutting.  Pin at 12:00 and pin at 6:00.  ECC performed wit Kevorkian curette and tissue to pathology as third specimen.  Monsel's placed.  Minimal EBL.  No complications.   Assessment:     CIN II.  Colposcopy with LEEP conization, ECC today.    Plan:     Fu biopsy results.  Post procedure instructions to patient.  FU in 4 weeks.  Next cervical cancer screening anticipated for December, 2022.

## 2020-08-04 ENCOUNTER — Ambulatory Visit (INDEPENDENT_AMBULATORY_CARE_PROVIDER_SITE_OTHER): Payer: BC Managed Care – PPO | Admitting: Obstetrics and Gynecology

## 2020-08-04 ENCOUNTER — Other Ambulatory Visit (HOSPITAL_COMMUNITY)
Admission: RE | Admit: 2020-08-04 | Discharge: 2020-08-04 | Disposition: A | Discharge status: Home / Self Care | Payer: BC Managed Care – PPO | Source: Ambulatory Visit | Attending: Obstetrics and Gynecology | Admitting: Obstetrics and Gynecology

## 2020-08-04 ENCOUNTER — Other Ambulatory Visit: Payer: Self-pay

## 2020-08-04 ENCOUNTER — Encounter: Payer: Self-pay | Admitting: Obstetrics and Gynecology

## 2020-08-04 VITALS — BP 110/60 | HR 86 | Resp 16 | Wt 144.0 lb

## 2020-08-04 DIAGNOSIS — Z01812 Encounter for preprocedural laboratory examination: Secondary | ICD-10-CM | POA: Diagnosis not present

## 2020-08-04 DIAGNOSIS — N871 Moderate cervical dysplasia: Secondary | ICD-10-CM | POA: Diagnosis not present

## 2020-08-04 HISTORY — PX: LEEP: SHX91

## 2020-08-04 LAB — PREGNANCY, URINE: Preg Test, Ur: NEGATIVE

## 2020-08-04 NOTE — Patient Instructions (Signed)
Loop Electrosurgical Excision Procedure Loop electrosurgical excision procedure (LEEP) is the cutting and removal (excision) of tissue from the cervix. The cervix is the bottom part of the uterus that opens into the vagina. The tissue that is removed from the cervix is examined to see if there are precancerous cells or cancer cells present. LEEP may be done when:  You have abnormal bleeding from your cervix.  You have an abnormal Pap test result.  Your health care provider finds an abnormality on your cervix during a pelvic exam. LEEP typically only takes a few minutes and is often done in the health care provider's office. The procedure is safe for women who are trying to get pregnant. However, the procedure is usually not done during a menstrual period or during pregnancy. Tell a health care provider about:  Any allergies you have.  All medicines you are taking, including vitamins, herbs, eye drops, creams, and over-the-counter medicines.  Any blood disorders you have.  Any medical conditions you have, including current or past vaginal infections such as herpes or sexually-transmitted infections (STIs).  Whether you are pregnant or may be pregnant.  Whether or not you are having vaginal bleeding on the day of the procedure. What are the risks? Generally, this is a safe procedure. However, problems may occur, including:  Infection.  Bleeding.  Allergic reactions to medicines.  Changes or scarring in the cervix.  Increased risk of early (preterm) labor in future pregnancies. What happens before the procedure?  Ask your health care provider about: ? Changing or stopping your regular medicines. This is especially important if you are taking diabetes medicines or blood thinners. ? Taking medicines such as aspirin and ibuprofen. These medicines can thin your blood. Do not take these medicines unless your health care provider tells you to take them. ? Taking over-the-counter  medicines, vitamins, herbs, and supplements.  Your health care provider may recommend that you take pain medicine before the procedure.  Ask your health care provider if you should plan to have someone take you home after the procedure. What happens during the procedure?  An instrument called a speculum will be placed in your vagina. This will allow your health care provider to see your cervix.  You will be given a medicine to numb the area (local anesthetic). The medicine will be injected into your cervix and the surrounding area.  A solution will be applied to your cervix. This solution will help the health care provider find the abnormal cells that need to be removed.  A thin wire loop will be passed through your vagina. The wire will be used to burn (cauterize) the cervical tissue with an electrical current.  You may feel faint during the procedure. Tell your health care provider right away if you feel this way.  The abnormal cervical tissue will be removed.  Any open blood vessels will be cauterized to prevent bleeding.  A paste may be applied to the cauterized area of your cervix to help prevent bleeding.  The sample of cervical tissue will be examined under a microscope. The procedure may vary among health care providers and hospitals.   What can I expect after the procedure? After the procedure, it is common to have:  Mild abdominal cramps that are similar to menstrual cramps. These may last for up to 1 week.  A small amount of pink-tinged or bloody vaginal discharge, including light to moderate bleeding, for 1-2 weeks.  A dark-colored discharge coming from your vagina. This is  from the paste that was used on the cervix to prevent bleeding. It is up to you to get the results of your procedure. Ask your health care provider, or the department that is doing the procedure, when your results will be ready. Follow these instructions at home:  Take over-the-counter and  prescription medicines only as told by your health care provider.  Return to your normal activities as told by your health care provider. Ask your health care provider what activities are safe for you.  Do not put anything in your vagina for 2 weeks after the procedure or until your health care provider says that it is okay. This includes tampons, creams, and douches.  Do not have sex until your health care provider approves.  Keep all follow-up visits as told by your health care provider. This is important. Contact a health care provider if you:  Have a fever or chills.  Feel unusually weak.  Have vaginal bleeding that is heavier or longer than a normal menstrual cycle. A sign of this can be soaking a pad with blood or bleeding with clots.  Develop a bad smelling vaginal discharge.  Have severe abdominal pain or cramping. Summary  Loop electrosurgical excision procedure (LEEP) is the removal of tissue from the cervix. The removed tissue will be checked for precancerous cells or cancer cells.  LEEP typically only takes a few minutes and is often done in the health care provider's office.  Do not put anything in your vagina for 2 weeks after the procedure or until your health care provider says that it is okay. This includes tampons, creams, and douches.  Keep all follow-up visits as told by your health care provider. Ask your health care provider, or the department that is doing the procedure, when your results will be ready. This information is not intended to replace advice given to you by your health care provider. Make sure you discuss any questions you have with your health care provider. Document Revised: 03/08/2018 Document Reviewed: 03/08/2018 Elsevier Patient Education  2021 [employer].

## 2020-08-06 LAB — SURGICAL PATHOLOGY

## 2020-08-09 ENCOUNTER — Encounter: Payer: Self-pay | Admitting: Obstetrics and Gynecology

## 2020-08-16 ENCOUNTER — Encounter: Payer: Self-pay | Admitting: Obstetrics and Gynecology

## 2020-08-16 ENCOUNTER — Other Ambulatory Visit: Payer: Self-pay

## 2020-08-17 ENCOUNTER — Encounter: Payer: Self-pay | Admitting: Internal Medicine

## 2020-08-17 ENCOUNTER — Ambulatory Visit (INDEPENDENT_AMBULATORY_CARE_PROVIDER_SITE_OTHER): Payer: BC Managed Care – PPO | Admitting: Internal Medicine

## 2020-08-17 VITALS — BP 126/72 | HR 78 | Temp 98.1°F | Resp 16 | Ht 64.0 in | Wt 144.0 lb

## 2020-08-17 DIAGNOSIS — J039 Acute tonsillitis, unspecified: Secondary | ICD-10-CM

## 2020-08-17 NOTE — Progress Notes (Signed)
Subjective:  Patient ID: Kaylee Hunt, female    DOB: June 12, 1985  Age: 35 y.o. MRN: [id number]  CC: Sore Throat  This visit occurred during the SARS-CoV-2 public health emergency.  Safety protocols were in place, including screening questions prior to the visit, additional usage of staff PPE, and extensive cleaning of exam room while observing appropriate contact time as indicated for disinfecting solutions.    HPI Kaylee Hunt presents for f/up - She saw someone else about a month ago for a sore throat.  She tells me the sore throat has resolved.  She came in today just to have the area rechecked.  Outpatient Medications Prior to Visit  Medication Sig Dispense Refill   traZODone (DESYREL) 50 MG tablet Take 3 tablets (150 mg total) by mouth at bedtime. (Patient taking differently: Take 100 mg by mouth at bedtime.) 270 tablet 1   No facility-administered medications prior to visit.    ROS Review of Systems  Constitutional:  Negative for chills, fatigue and fever.  HENT: Negative.  Negative for sore throat and trouble swallowing.   Respiratory:  Negative for cough and shortness of breath.   Gastrointestinal:  Negative for abdominal pain.  Genitourinary: Negative.   Musculoskeletal: Negative.   Skin: Negative.  Negative for rash.  Neurological: Negative.  Negative for dizziness, weakness and light-headedness.  Hematological:  Negative for adenopathy. Does not bruise/bleed easily.  Psychiatric/Behavioral: Negative.     Objective:  BP 126/72 (BP Location: Left Arm, Patient Position: Sitting, Cuff Size: Large)   Pulse 78   Temp 98.1 F (36.7 C) (Oral)   Resp 16   Ht 5\' 4"  (1.626 m)   Wt 144 lb (65.3 kg)   SpO2 99%   BMI 24.72 kg/m   BP Readings from Last 3 Encounters:  08/17/20 126/72  08/04/20 110/60  07/13/20 120/74    Wt Readings from Last 3 Encounters:  08/17/20 144 lb (65.3 kg)  08/04/20 144 lb (65.3 kg)  07/13/20 144 lb (65.3 kg)    Physical Exam Vitals  reviewed.  Constitutional:      Appearance: She is not ill-appearing.  HENT:     Mouth/Throat:     Mouth: Mucous membranes are moist.     Pharynx: No pharyngeal swelling, oropharyngeal exudate or posterior oropharyngeal erythema.     Tonsils: No tonsillar exudate. 0 on the right. 0 on the left.  Eyes:     General: No scleral icterus.    Conjunctiva/sclera: Conjunctivae normal.  Cardiovascular:     Rate and Rhythm: Normal rate and regular rhythm.     Heart sounds: Normal heart sounds. No murmur heard. Pulmonary:     Breath sounds: No stridor. No wheezing, rhonchi or rales.  Chest:  Breasts:    Right: No supraclavicular adenopathy.     Left: No supraclavicular adenopathy.  Abdominal:     General: Abdomen is flat. Bowel sounds are normal. There is no distension.     Palpations: Abdomen is soft. There is no hepatomegaly or mass.     Tenderness: There is no abdominal tenderness.  Musculoskeletal:        General: Normal range of motion.     Cervical back: Neck supple.     Right lower leg: No edema.     Left lower leg: No edema.  Lymphadenopathy:     Cervical: No cervical adenopathy.     Right cervical: No superficial, deep or posterior cervical adenopathy.    Left cervical: No superficial, deep or posterior cervical  adenopathy.     Upper Body:     Right upper body: No supraclavicular adenopathy.     Left upper body: No supraclavicular adenopathy.  Skin:    General: Skin is warm and dry.  Neurological:     General: No focal deficit present.     Mental Status: She is alert.    Lab Results  Component Value Date   WBC 7.8 05/13/2020   HGB 14.3 05/13/2020   HCT 41.6 05/13/2020   PLT 225.0 05/13/2020   GLUCOSE 90 05/13/2020   CHOL 118 05/13/2020   TRIG 40.0 05/13/2020   HDL 60.80 05/13/2020   LDLCALC 49 05/13/2020   ALT 12 05/13/2020   AST 14 05/13/2020   NA 140 05/13/2020   K 4.0 05/13/2020   CL 103 05/13/2020   CREATININE 0.66 05/13/2020   BUN 12 05/13/2020   CO2 28  05/13/2020   TSH 1.42 05/13/2020    No results found.  Assessment & Plan:   Kaylee Hunt was seen today for sore throat.  Diagnoses and all orders for this visit:  Tonsillitis with exudate- This has resolved.  I am having Kaylee Hunt maintain her traZODone.  No orders of the defined types were placed in this encounter.    Follow-up: Return in about 3 months (around 11/17/2020).  Kaylee Calico, MD

## 2020-08-17 NOTE — Patient Instructions (Signed)
Sore Throat A sore throat is pain, burning, irritation, or scratchiness in the throat. When you have a sore throat, you may feel pain or tenderness in your throat when youswallow or talk. Many things can cause a sore throat, including: An infection. Seasonal allergies. Dryness in the air. Irritants, such as smoke or pollution. Radiation treatment to the area. Gastroesophageal reflux disease (GERD). A tumor. A sore throat is often the first sign of another sickness. It may happen with other symptoms, such as coughing, sneezing, fever, and swollen neck glands.Most sore throats go away without medical treatment. Follow these instructions at home:     Take over-the-counter medicines only as told by your health care provider. If your child has a sore throat, do not give your child aspirin because of the association with Reye syndrome. Drink enough fluids to keep your urine pale yellow. Rest as needed. To help with pain, try: Sipping warm liquids, such as broth, herbal tea, or warm water. Eating or drinking cold or frozen liquids, such as frozen ice pops. Gargling with a salt-water mixture 3-4 times a day or as needed. To make a salt-water mixture, completely dissolve -1 tsp (3-6 g) of salt in 1 cup (237 mL) of warm water. Sucking on hard candy or throat lozenges. Putting a cool-mist humidifier in your bedroom at night to moisten the air. Sitting in the bathroom with the door closed for 5-10 minutes while you run hot water in the shower. Do not use any products that contain nicotine or tobacco, such as cigarettes, e-cigarettes, and chewing tobacco. If you need help quitting, ask your health care provider. Wash your hands well and often with soap and water. If soap and water are not available, use hand sanitizer. Contact a health care provider if: You have a fever for more than 2-3 days. You have symptoms that last (are persistent) for more than 2-3 days. Your throat does not get better  within 7 days. You have a fever and your symptoms suddenly get worse. Your child who is 3 months to 6 years old has a temperature of 102.78F (39C) or higher. Get help right away if: You have difficulty breathing. You cannot swallow fluids, soft foods, or your saliva. You have increased swelling in your throat or neck. You have persistent nausea and vomiting. Summary A sore throat is pain, burning, irritation, or scratchiness in the throat. Many things can cause a sore throat. Take over-the-counter medicines only as told by your health care provider. Do not give your child aspirin. Drink plenty of fluids, and rest as needed. Contact a health care provider if your symptoms worsen or your sore throat does not get better within 7 days. This information is not intended to replace advice given to you by your health care provider. Make sure you discuss any questions you have with your healthcare provider. Document Revised: 07/16/2017 Document Reviewed: 07/16/2017 Elsevier Patient Education  2022 [employer].

## 2020-09-02 ENCOUNTER — Other Ambulatory Visit: Payer: Self-pay

## 2020-09-02 ENCOUNTER — Ambulatory Visit (INDEPENDENT_AMBULATORY_CARE_PROVIDER_SITE_OTHER): Payer: BC Managed Care – PPO | Admitting: *Deleted

## 2020-09-02 DIAGNOSIS — Z23 Encounter for immunization: Secondary | ICD-10-CM

## 2020-09-08 ENCOUNTER — Other Ambulatory Visit: Payer: Self-pay

## 2020-09-08 ENCOUNTER — Encounter: Payer: Self-pay | Admitting: Obstetrics and Gynecology

## 2020-09-08 ENCOUNTER — Ambulatory Visit (INDEPENDENT_AMBULATORY_CARE_PROVIDER_SITE_OTHER): Payer: BC Managed Care – PPO | Admitting: Obstetrics and Gynecology

## 2020-09-08 ENCOUNTER — Other Ambulatory Visit (HOSPITAL_COMMUNITY)
Admission: RE | Admit: 2020-09-08 | Discharge: 2020-09-08 | Disposition: A | Discharge status: Home / Self Care | Payer: BC Managed Care – PPO | Source: Ambulatory Visit | Attending: Obstetrics and Gynecology | Admitting: Obstetrics and Gynecology

## 2020-09-08 VITALS — BP 130/80

## 2020-09-08 DIAGNOSIS — N898 Other specified noninflammatory disorders of vagina: Secondary | ICD-10-CM

## 2020-09-08 DIAGNOSIS — Z9889 Other specified postprocedural states: Secondary | ICD-10-CM | POA: Diagnosis not present
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
## 2020-09-10 ENCOUNTER — Encounter: Payer: Self-pay | Admitting: Obstetrics and Gynecology

## 2020-09-10 LAB — CERVICOVAGINAL ANCILLARY ONLY
Bacterial Vaginitis (gardnerella): POSITIVE — AB
Candida Glabrata: NEGATIVE
Candida Vaginitis: NEGATIVE
Comment: NEGATIVE
Comment: NEGATIVE
Comment: NEGATIVE
Comment: NEGATIVE
Trichomonas: NEGATIVE

## 2020-09-10 MED ORDER — METRONIDAZOLE 500 MG PO TABS: 500.0000 mg | ORAL_TABLET | Freq: Two times a day (BID) | ORAL | 0 refills | Status: DC

## 2020-09-23 ENCOUNTER — Ambulatory Visit (INDEPENDENT_AMBULATORY_CARE_PROVIDER_SITE_OTHER): Payer: BC Managed Care – PPO

## 2020-09-23 ENCOUNTER — Other Ambulatory Visit: Payer: Self-pay

## 2020-09-23 VITALS — BP 100/58

## 2020-09-23 DIAGNOSIS — Z309 Encounter for contraceptive management, unspecified: Secondary | ICD-10-CM | POA: Diagnosis not present

## 2020-09-23 MED ORDER — MEDROXYPROGESTERONE ACETATE 150 MG/ML IM SUSP
150.0000 mg | Freq: Once | INTRAMUSCULAR | Status: AC
  Administered 2020-09-23: 150 mg via INTRAMUSCULAR

## 2020-09-23 NOTE — Progress Notes (Signed)
Patient is here for Depo Provera Injection Patient is within Depo Provera Calender Limits Yes Next Depo Due between: 10/13 -10/27 Last AEX: 06-09-20 AEX Scheduled: 06-16-21  Patient is aware when next depo is due.  Pt tolerated Injection well in RUQ.  Routed to provider for review, encounter closed.

## 2020-11-09 DIAGNOSIS — M545 Low back pain, unspecified: Secondary | ICD-10-CM | POA: Diagnosis not present

## 2020-11-19 ENCOUNTER — Encounter: Payer: Self-pay | Admitting: Nurse Practitioner

## 2020-11-19 ENCOUNTER — Telehealth (INDEPENDENT_AMBULATORY_CARE_PROVIDER_SITE_OTHER): Payer: BC Managed Care – PPO | Admitting: Nurse Practitioner

## 2020-11-19 DIAGNOSIS — R059 Cough, unspecified: Secondary | ICD-10-CM | POA: Diagnosis not present

## 2020-11-19 MED ORDER — ALBUTEROL SULFATE HFA 108 (90 BASE) MCG/ACT IN AERS: 2.0000 | INHALATION_SPRAY | Freq: Four times a day (QID) | RESPIRATORY_TRACT | 0 refills | Status: DC | PRN

## 2020-11-19 MED ORDER — AZITHROMYCIN 250 MG PO TABS: ORAL_TABLET | ORAL | 0 refills | Status: AC

## 2020-11-19 MED ORDER — BENZONATATE 200 MG PO CAPS: 200.0000 mg | ORAL_CAPSULE | Freq: Two times a day (BID) | ORAL | 0 refills | Status: DC | PRN

## 2020-11-19 MED ORDER — PREDNISONE 20 MG PO TABS: 40.0000 mg | ORAL_TABLET | Freq: Every day | ORAL | 0 refills | Status: DC

## 2020-11-19 NOTE — Progress Notes (Signed)
Due to national recommendations of social distancing related to the [place] pandemic, an audio-only tele-health visit was felt to be the most appropriate encounter type for this patient today. I connected with  Kaylee Hunt on 11/19/20 utilizing audio-only technology and verified that I am speaking with the correct person using two identifiers. The patient was located at their place of employment, and I was located at home during the encounter. I discussed the limitations of evaluation and management by telemedicine. The patient expressed understanding and agreed to proceed.     Subjective:  Patient ID: Kaylee Hunt, female    DOB: Aug 14, 1985  Age: 35 y.o. MRN: [id number]  CC:  Chief Complaint  Patient presents with   Cough      HPI  This patient arrives today for a virtual visit for the above.  She tells me she has been experiencing sore throat and cough for approximately 6 days.  She tells me her symptoms have waxed and waned over the last 6 days.  She is taken for COVID-19 test all of which have been negative.  She is also completely vaccinated against COVID-19.  She has been experiencing shortness of breath with exertion, some chest tightness, and some chest pressure.  She denies fever or cold chills.  Past Medical History:  Diagnosis Date   Abnormal Pap smear of cervix    History of poor fetal growth 09/10/2014   History of premature delivery 08/19/2014   31 4/7 weeks due to HELLP and IUGR, breech   Hx of preeclampsia, prior pregnancy, currently pregnant 09/10/2014      Family History  Problem Relation Age of Onset   Depression Mother    Breast cancer Mother 61   Cancer Father        Skin   Post-traumatic stress disorder Father    Depression Father    Anxiety disorder Father    Depression Brother    Heart murmur Brother    Alcohol abuse Maternal Grandfather    Cancer Maternal Grandfather        ? type    Social History   Social History Narrative   Not on file    Social History   Tobacco Use   Smoking status: Former    Types: Cigarettes    Quit date: 09/09/2008    Years since quitting: 12.2   Smokeless tobacco: Never  Substance Use Topics   Alcohol use: Yes    Comment: 12     Current Meds  Medication Sig   medroxyPROGESTERone (DEPO-PROVERA) 150 MG/ML injection Inject 150 mg into the muscle every 3 (three) months.   traZODone (DESYREL) 50 MG tablet Take 3 tablets (150 mg total) by mouth at bedtime. (Patient taking differently: Take 100 mg by mouth at bedtime.)    ROS:  Review of Systems  Constitutional:  Positive for malaise/fatigue. Negative for chills and fever.  Respiratory:  Positive for cough, sputum production and shortness of breath (with activity). Negative for wheezing.   Cardiovascular:  Negative for chest pain.  Gastrointestinal:  Negative for abdominal pain, diarrhea, nausea and vomiting.  Neurological:  Negative for headaches.    Objective:   Today's Vitals: There were no vitals taken for this visit. Vitals with BMI 09/23/2020 09/08/2020 08/17/2020  Height - - 5\' 4"   Weight - - 144 lbs  BMI - - 78.29  Systolic 562 130 865  Diastolic 58 80 72  Pulse - - 78     Physical Exam Comprehensive physical exam not completed  today as office visit was conducted remotely.  She sounded congested over the phone, she also was coughing a few times.  She did not have to stop talking to breathe.  Patient was alert and oriented, and appeared to have appropriate judgment.       Assessment and Plan   1. Cough      Plan: 1.  Most likely bronchitis.  Will treat with prednisone, albuterol inhaler, and Tessalon Perles.  Will prescribe Z-Pak that she can take if symptoms worsen significantly over the weekend.  She was told to try to not take the Z-Pak unless she absolutely has to, and if symptoms worsen over the weekend she needs to call our office first thing next week so we can consider getting chest x-ray for further evaluation.   She is agreeable to this plan and endorses her understanding.   Tests ordered No orders of the defined types were placed in this encounter.     No orders of the defined types were placed in this encounter.   Patient to follow-up in as needed.  Total time spent on the telephone was 12 minutes and 54 seconds.  Ailene Ards, NP

## 2020-12-23 ENCOUNTER — Other Ambulatory Visit: Payer: Self-pay

## 2020-12-23 ENCOUNTER — Ambulatory Visit (INDEPENDENT_AMBULATORY_CARE_PROVIDER_SITE_OTHER): Payer: BC Managed Care – PPO | Admitting: Gynecology

## 2020-12-23 DIAGNOSIS — Z3042 Encounter for surveillance of injectable contraceptive: Secondary | ICD-10-CM | POA: Diagnosis not present

## 2020-12-23 MED ORDER — MEDROXYPROGESTERONE ACETATE 150 MG/ML IM SUSP
150.0000 mg | Freq: Once | INTRAMUSCULAR | Status: AC
  Administered 2020-12-23: 150 mg via INTRAMUSCULAR

## 2021-01-05 ENCOUNTER — Ambulatory Visit (INDEPENDENT_AMBULATORY_CARE_PROVIDER_SITE_OTHER): Payer: BC Managed Care – PPO | Admitting: Gynecology

## 2021-01-05 ENCOUNTER — Other Ambulatory Visit: Payer: Self-pay

## 2021-01-05 DIAGNOSIS — Z23 Encounter for immunization: Secondary | ICD-10-CM | POA: Diagnosis not present

## 2021-01-25 ENCOUNTER — Other Ambulatory Visit: Payer: Self-pay | Admitting: Internal Medicine

## 2021-01-25 DIAGNOSIS — F411 Generalized anxiety disorder: Secondary | ICD-10-CM
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
## 2021-02-02 ENCOUNTER — Ambulatory Visit (INDEPENDENT_AMBULATORY_CARE_PROVIDER_SITE_OTHER): Payer: BC Managed Care – PPO | Admitting: Obstetrics and Gynecology

## 2021-02-02 ENCOUNTER — Other Ambulatory Visit: Payer: Self-pay

## 2021-02-02 ENCOUNTER — Other Ambulatory Visit (HOSPITAL_COMMUNITY)
Admission: RE | Admit: 2021-02-02 | Discharge: 2021-02-02 | Disposition: A | Discharge status: Home / Self Care | Payer: BC Managed Care – PPO | Source: Ambulatory Visit | Attending: Obstetrics and Gynecology | Admitting: Obstetrics and Gynecology

## 2021-02-02 ENCOUNTER — Encounter: Payer: Self-pay | Admitting: Obstetrics and Gynecology

## 2021-02-02 VITALS — BP 112/70 | HR 84 | Resp 18

## 2021-02-02 DIAGNOSIS — Z3042 Encounter for surveillance of injectable contraceptive: Secondary | ICD-10-CM | POA: Diagnosis not present

## 2021-02-02 DIAGNOSIS — Z87410 Personal history of cervical dysplasia: Secondary | ICD-10-CM | POA: Insufficient documentation

## 2021-02-07 LAB — CYTOLOGY - PAP
Comment: NEGATIVE
Diagnosis: NEGATIVE
Diagnosis: REACTIVE
High risk HPV: NEGATIVE

## 2021-03-10 ENCOUNTER — Ambulatory Visit: Payer: BC Managed Care – PPO

## 2021-03-11 ENCOUNTER — Other Ambulatory Visit: Payer: Self-pay | Admitting: Chiropractic Medicine

## 2021-03-11 ENCOUNTER — Ambulatory Visit
Admission: RE | Admit: 2021-03-11 | Discharge: 2021-03-11 | Disposition: A | Discharge status: Home / Self Care | Payer: BC Managed Care – PPO | Source: Ambulatory Visit | Attending: Chiropractic Medicine | Admitting: Chiropractic Medicine

## 2021-03-11 ENCOUNTER — Other Ambulatory Visit: Payer: Self-pay

## 2021-03-11 DIAGNOSIS — R102 Pelvic and perineal pain: Secondary | ICD-10-CM | POA: Diagnosis not present

## 2021-03-11 DIAGNOSIS — M5459 Other low back pain: Secondary | ICD-10-CM | POA: Diagnosis not present

## 2021-03-11 DIAGNOSIS — M62830 Muscle spasm of back: Secondary | ICD-10-CM | POA: Diagnosis not present

## 2021-03-11 DIAGNOSIS — M545 Low back pain, unspecified: Secondary | ICD-10-CM

## 2021-03-11 DIAGNOSIS — M9905 Segmental and somatic dysfunction of pelvic region: Secondary | ICD-10-CM | POA: Diagnosis not present

## 2021-03-11 DIAGNOSIS — M9903 Segmental and somatic dysfunction of lumbar region: Secondary | ICD-10-CM | POA: Diagnosis not present

## 2021-03-11 DIAGNOSIS — M25559 Pain in unspecified hip: Secondary | ICD-10-CM | POA: Diagnosis not present

## 2021-03-11 DIAGNOSIS — M9904 Segmental and somatic dysfunction of sacral region: Secondary | ICD-10-CM | POA: Diagnosis not present

## 2021-03-11 DIAGNOSIS — M9902 Segmental and somatic dysfunction of thoracic region: Secondary | ICD-10-CM | POA: Diagnosis not present

## 2021-03-11 DIAGNOSIS — M2560 Stiffness of unspecified joint, not elsewhere classified: Secondary | ICD-10-CM | POA: Diagnosis not present

## 2021-03-15 DIAGNOSIS — M62830 Muscle spasm of back: Secondary | ICD-10-CM | POA: Diagnosis not present

## 2021-03-15 DIAGNOSIS — M9903 Segmental and somatic dysfunction of lumbar region: Secondary | ICD-10-CM | POA: Diagnosis not present

## 2021-03-15 DIAGNOSIS — M9905 Segmental and somatic dysfunction of pelvic region: Secondary | ICD-10-CM | POA: Diagnosis not present

## 2021-03-15 DIAGNOSIS — M5459 Other low back pain: Secondary | ICD-10-CM | POA: Diagnosis not present

## 2021-03-15 DIAGNOSIS — M9902 Segmental and somatic dysfunction of thoracic region: Secondary | ICD-10-CM | POA: Diagnosis not present

## 2021-03-15 DIAGNOSIS — M5137 Other intervertebral disc degeneration, lumbosacral region: Secondary | ICD-10-CM | POA: Diagnosis not present

## 2021-03-17 DIAGNOSIS — M62830 Muscle spasm of back: Secondary | ICD-10-CM | POA: Diagnosis not present

## 2021-03-17 DIAGNOSIS — M5459 Other low back pain: Secondary | ICD-10-CM | POA: Diagnosis not present

## 2021-03-17 DIAGNOSIS — M9902 Segmental and somatic dysfunction of thoracic region: Secondary | ICD-10-CM | POA: Diagnosis not present

## 2021-03-17 DIAGNOSIS — M5137 Other intervertebral disc degeneration, lumbosacral region: Secondary | ICD-10-CM | POA: Diagnosis not present

## 2021-03-17 DIAGNOSIS — M9903 Segmental and somatic dysfunction of lumbar region: Secondary | ICD-10-CM | POA: Diagnosis not present

## 2021-03-17 DIAGNOSIS — M9905 Segmental and somatic dysfunction of pelvic region: Secondary | ICD-10-CM | POA: Diagnosis not present

## 2021-03-21 DIAGNOSIS — M9902 Segmental and somatic dysfunction of thoracic region: Secondary | ICD-10-CM | POA: Diagnosis not present

## 2021-03-21 DIAGNOSIS — M5137 Other intervertebral disc degeneration, lumbosacral region: Secondary | ICD-10-CM | POA: Diagnosis not present

## 2021-03-21 DIAGNOSIS — M5459 Other low back pain: Secondary | ICD-10-CM | POA: Diagnosis not present

## 2021-03-21 DIAGNOSIS — M62830 Muscle spasm of back: Secondary | ICD-10-CM | POA: Diagnosis not present

## 2021-03-21 DIAGNOSIS — M9905 Segmental and somatic dysfunction of pelvic region: Secondary | ICD-10-CM | POA: Diagnosis not present

## 2021-03-21 DIAGNOSIS — M9903 Segmental and somatic dysfunction of lumbar region: Secondary | ICD-10-CM | POA: Diagnosis not present

## 2021-03-23 DIAGNOSIS — M9902 Segmental and somatic dysfunction of thoracic region: Secondary | ICD-10-CM | POA: Diagnosis not present

## 2021-03-23 DIAGNOSIS — M62830 Muscle spasm of back: Secondary | ICD-10-CM | POA: Diagnosis not present

## 2021-03-23 DIAGNOSIS — M9905 Segmental and somatic dysfunction of pelvic region: Secondary | ICD-10-CM | POA: Diagnosis not present

## 2021-03-23 DIAGNOSIS — M5137 Other intervertebral disc degeneration, lumbosacral region: Secondary | ICD-10-CM | POA: Diagnosis not present

## 2021-03-23 DIAGNOSIS — M9903 Segmental and somatic dysfunction of lumbar region: Secondary | ICD-10-CM | POA: Diagnosis not present

## 2021-03-23 DIAGNOSIS — M5459 Other low back pain: Secondary | ICD-10-CM | POA: Diagnosis not present

## 2021-03-25 DIAGNOSIS — M5137 Other intervertebral disc degeneration, lumbosacral region: Secondary | ICD-10-CM | POA: Diagnosis not present

## 2021-03-25 DIAGNOSIS — R202 Paresthesia of skin: Secondary | ICD-10-CM | POA: Diagnosis not present

## 2021-03-25 DIAGNOSIS — M9905 Segmental and somatic dysfunction of pelvic region: Secondary | ICD-10-CM | POA: Diagnosis not present

## 2021-03-25 DIAGNOSIS — M9902 Segmental and somatic dysfunction of thoracic region: Secondary | ICD-10-CM | POA: Diagnosis not present

## 2021-03-25 DIAGNOSIS — M5459 Other low back pain: Secondary | ICD-10-CM | POA: Diagnosis not present

## 2021-03-25 DIAGNOSIS — M9903 Segmental and somatic dysfunction of lumbar region: Secondary | ICD-10-CM | POA: Diagnosis not present

## 2021-03-28 DIAGNOSIS — M5137 Other intervertebral disc degeneration, lumbosacral region: Secondary | ICD-10-CM | POA: Diagnosis not present

## 2021-03-28 DIAGNOSIS — M9903 Segmental and somatic dysfunction of lumbar region: Secondary | ICD-10-CM | POA: Diagnosis not present

## 2021-03-28 DIAGNOSIS — M9902 Segmental and somatic dysfunction of thoracic region: Secondary | ICD-10-CM | POA: Diagnosis not present

## 2021-03-28 DIAGNOSIS — R202 Paresthesia of skin: Secondary | ICD-10-CM | POA: Diagnosis not present

## 2021-03-28 DIAGNOSIS — M5459 Other low back pain: Secondary | ICD-10-CM | POA: Diagnosis not present

## 2021-03-28 DIAGNOSIS — M9905 Segmental and somatic dysfunction of pelvic region: Secondary | ICD-10-CM | POA: Diagnosis not present

## 2021-03-30 DIAGNOSIS — M5459 Other low back pain: Secondary | ICD-10-CM | POA: Diagnosis not present

## 2021-03-30 DIAGNOSIS — R202 Paresthesia of skin: Secondary | ICD-10-CM | POA: Diagnosis not present

## 2021-03-30 DIAGNOSIS — M9903 Segmental and somatic dysfunction of lumbar region: Secondary | ICD-10-CM | POA: Diagnosis not present

## 2021-03-30 DIAGNOSIS — M9905 Segmental and somatic dysfunction of pelvic region: Secondary | ICD-10-CM | POA: Diagnosis not present

## 2021-03-30 DIAGNOSIS — M5137 Other intervertebral disc degeneration, lumbosacral region: Secondary | ICD-10-CM | POA: Diagnosis not present

## 2021-03-30 DIAGNOSIS — M9902 Segmental and somatic dysfunction of thoracic region: Secondary | ICD-10-CM | POA: Diagnosis not present

## 2021-04-01 DIAGNOSIS — R0782 Intercostal pain: Secondary | ICD-10-CM | POA: Diagnosis not present

## 2021-04-01 DIAGNOSIS — M2560 Stiffness of unspecified joint, not elsewhere classified: Secondary | ICD-10-CM | POA: Diagnosis not present

## 2021-04-01 DIAGNOSIS — M5459 Other low back pain: Secondary | ICD-10-CM | POA: Diagnosis not present

## 2021-04-01 DIAGNOSIS — R202 Paresthesia of skin: Secondary | ICD-10-CM | POA: Diagnosis not present

## 2021-04-01 DIAGNOSIS — M9902 Segmental and somatic dysfunction of thoracic region: Secondary | ICD-10-CM | POA: Diagnosis not present

## 2021-04-01 DIAGNOSIS — M9903 Segmental and somatic dysfunction of lumbar region: Secondary | ICD-10-CM | POA: Diagnosis not present

## 2021-04-01 DIAGNOSIS — M5137 Other intervertebral disc degeneration, lumbosacral region: Secondary | ICD-10-CM | POA: Diagnosis not present

## 2021-04-01 DIAGNOSIS — M9905 Segmental and somatic dysfunction of pelvic region: Secondary | ICD-10-CM | POA: Diagnosis not present

## 2021-04-01 DIAGNOSIS — M9908 Segmental and somatic dysfunction of rib cage: Secondary | ICD-10-CM | POA: Diagnosis not present

## 2021-04-01 DIAGNOSIS — M9901 Segmental and somatic dysfunction of cervical region: Secondary | ICD-10-CM | POA: Diagnosis not present

## 2021-04-04 DIAGNOSIS — M2560 Stiffness of unspecified joint, not elsewhere classified: Secondary | ICD-10-CM | POA: Diagnosis not present

## 2021-04-04 DIAGNOSIS — M5459 Other low back pain: Secondary | ICD-10-CM | POA: Diagnosis not present

## 2021-04-04 DIAGNOSIS — M5137 Other intervertebral disc degeneration, lumbosacral region: Secondary | ICD-10-CM | POA: Diagnosis not present

## 2021-04-04 DIAGNOSIS — M9902 Segmental and somatic dysfunction of thoracic region: Secondary | ICD-10-CM | POA: Diagnosis not present

## 2021-04-04 DIAGNOSIS — R202 Paresthesia of skin: Secondary | ICD-10-CM | POA: Diagnosis not present

## 2021-04-04 DIAGNOSIS — M9905 Segmental and somatic dysfunction of pelvic region: Secondary | ICD-10-CM | POA: Diagnosis not present

## 2021-04-04 DIAGNOSIS — M9901 Segmental and somatic dysfunction of cervical region: Secondary | ICD-10-CM | POA: Diagnosis not present

## 2021-04-04 DIAGNOSIS — M9903 Segmental and somatic dysfunction of lumbar region: Secondary | ICD-10-CM | POA: Diagnosis not present

## 2021-04-09 ENCOUNTER — Other Ambulatory Visit: Payer: Self-pay

## 2021-04-09 ENCOUNTER — Ambulatory Visit
Admission: EM | Admit: 2021-04-09 | Discharge: 2021-04-09 | Disposition: A | Discharge status: Home / Self Care | Payer: BC Managed Care – PPO | Attending: Emergency Medicine | Admitting: Emergency Medicine

## 2021-04-09 DIAGNOSIS — J069 Acute upper respiratory infection, unspecified: Secondary | ICD-10-CM | POA: Diagnosis not present

## 2021-04-09 LAB — GROUP A STREP BY PCR: Group A Strep by PCR: NOT DETECTED

## 2021-04-09 MED ORDER — IPRATROPIUM BROMIDE 0.06 % NA SOLN: 2.0000 | Freq: Four times a day (QID) | NASAL | 12 refills | Status: DC

## 2021-04-09 MED ORDER — PROMETHAZINE-DM 6.25-15 MG/5ML PO SYRP: 5.0000 mL | ORAL_SOLUTION | Freq: Four times a day (QID) | ORAL | 0 refills | Status: DC | PRN

## 2021-04-09 MED ORDER — BENZONATATE 100 MG PO CAPS: 200.0000 mg | ORAL_CAPSULE | Freq: Three times a day (TID) | ORAL | 0 refills | Status: DC
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
## 2021-04-09 NOTE — Discharge Instructions (Addendum)
Use the Atrovent nasal spray, 2 squirts in each nostril every 6 hours, as needed for runny nose and postnasal drip.  Use the Tessalon Perles every 8 hours during the day.  Take them with a small sip of water.  They may give you some numbness to the base of your tongue or a metallic taste in your mouth, this is normal.  Use the Promethazine DM cough syrup at bedtime for cough and congestion.  It will make you drowsy so do not take it during the day.  Use over-the-counter Tylenol and ibuprofen as needed for fever and pain.  Gargle with warm salt water as needed to soothe your throat.  This will help wash away any surface viral material and exudate to improve your comfort.  The salt water will also shrink the tissues which should provide some pain relief as well.  You may also use over-the-counter Sucrets or Chloraseptic lozenges but do not use more than 1 lozenge every 2 hours as the menthol may give you diarrhea.  Return for reevaluation or see your primary care provider for any new or worsening symptoms.

## 2021-04-09 NOTE — ED Triage Notes (Signed)
Pt here with C/O sore throat since Tuesday, had body aches but has since went away, started with nasal congestion and chest congestion when waking up this morning. Took at home Covid, was negative.

## 2021-04-13 DIAGNOSIS — M5137 Other intervertebral disc degeneration, lumbosacral region: Secondary | ICD-10-CM | POA: Diagnosis not present

## 2021-04-13 DIAGNOSIS — M2560 Stiffness of unspecified joint, not elsewhere classified: Secondary | ICD-10-CM | POA: Diagnosis not present

## 2021-04-13 DIAGNOSIS — M9902 Segmental and somatic dysfunction of thoracic region: Secondary | ICD-10-CM | POA: Diagnosis not present

## 2021-04-13 DIAGNOSIS — M9905 Segmental and somatic dysfunction of pelvic region: Secondary | ICD-10-CM | POA: Diagnosis not present

## 2021-04-13 DIAGNOSIS — M9901 Segmental and somatic dysfunction of cervical region: Secondary | ICD-10-CM | POA: Diagnosis not present

## 2021-04-13 DIAGNOSIS — M9903 Segmental and somatic dysfunction of lumbar region: Secondary | ICD-10-CM | POA: Diagnosis not present

## 2021-04-15 DIAGNOSIS — M9901 Segmental and somatic dysfunction of cervical region: Secondary | ICD-10-CM | POA: Diagnosis not present

## 2021-04-15 DIAGNOSIS — M2560 Stiffness of unspecified joint, not elsewhere classified: Secondary | ICD-10-CM | POA: Diagnosis not present

## 2021-04-15 DIAGNOSIS — M9905 Segmental and somatic dysfunction of pelvic region: Secondary | ICD-10-CM | POA: Diagnosis not present

## 2021-04-15 DIAGNOSIS — M5137 Other intervertebral disc degeneration, lumbosacral region: Secondary | ICD-10-CM | POA: Diagnosis not present

## 2021-04-15 DIAGNOSIS — M9902 Segmental and somatic dysfunction of thoracic region: Secondary | ICD-10-CM | POA: Diagnosis not present

## 2021-04-15 DIAGNOSIS — M9903 Segmental and somatic dysfunction of lumbar region: Secondary | ICD-10-CM | POA: Diagnosis not present

## 2021-04-20 DIAGNOSIS — M9902 Segmental and somatic dysfunction of thoracic region: Secondary | ICD-10-CM | POA: Diagnosis not present

## 2021-04-20 DIAGNOSIS — M5137 Other intervertebral disc degeneration, lumbosacral region: Secondary | ICD-10-CM | POA: Diagnosis not present

## 2021-04-20 DIAGNOSIS — M9905 Segmental and somatic dysfunction of pelvic region: Secondary | ICD-10-CM | POA: Diagnosis not present

## 2021-04-20 DIAGNOSIS — M9901 Segmental and somatic dysfunction of cervical region: Secondary | ICD-10-CM | POA: Diagnosis not present

## 2021-04-20 DIAGNOSIS — M9903 Segmental and somatic dysfunction of lumbar region: Secondary | ICD-10-CM | POA: Diagnosis not present

## 2021-04-20 DIAGNOSIS — M2560 Stiffness of unspecified joint, not elsewhere classified: Secondary | ICD-10-CM | POA: Diagnosis not present

## 2021-04-25 DIAGNOSIS — M5137 Other intervertebral disc degeneration, lumbosacral region: Secondary | ICD-10-CM | POA: Diagnosis not present

## 2021-04-25 DIAGNOSIS — M9905 Segmental and somatic dysfunction of pelvic region: Secondary | ICD-10-CM | POA: Diagnosis not present

## 2021-04-25 DIAGNOSIS — M9901 Segmental and somatic dysfunction of cervical region: Secondary | ICD-10-CM | POA: Diagnosis not present

## 2021-04-25 DIAGNOSIS — M9903 Segmental and somatic dysfunction of lumbar region: Secondary | ICD-10-CM | POA: Diagnosis not present

## 2021-04-25 DIAGNOSIS — M2560 Stiffness of unspecified joint, not elsewhere classified: Secondary | ICD-10-CM | POA: Diagnosis not present

## 2021-04-25 DIAGNOSIS — M9902 Segmental and somatic dysfunction of thoracic region: Secondary | ICD-10-CM | POA: Diagnosis not present

## 2021-04-26 DIAGNOSIS — M2560 Stiffness of unspecified joint, not elsewhere classified: Secondary | ICD-10-CM | POA: Diagnosis not present

## 2021-04-26 DIAGNOSIS — M5137 Other intervertebral disc degeneration, lumbosacral region: Secondary | ICD-10-CM | POA: Diagnosis not present

## 2021-04-26 DIAGNOSIS — M9903 Segmental and somatic dysfunction of lumbar region: Secondary | ICD-10-CM | POA: Diagnosis not present

## 2021-04-26 DIAGNOSIS — M9905 Segmental and somatic dysfunction of pelvic region: Secondary | ICD-10-CM | POA: Diagnosis not present

## 2021-04-26 DIAGNOSIS — M9902 Segmental and somatic dysfunction of thoracic region: Secondary | ICD-10-CM | POA: Diagnosis not present

## 2021-04-26 DIAGNOSIS — M9901 Segmental and somatic dysfunction of cervical region: Secondary | ICD-10-CM | POA: Diagnosis not present

## 2021-04-27 DIAGNOSIS — M9903 Segmental and somatic dysfunction of lumbar region: Secondary | ICD-10-CM | POA: Diagnosis not present

## 2021-04-27 DIAGNOSIS — M5137 Other intervertebral disc degeneration, lumbosacral region: Secondary | ICD-10-CM | POA: Diagnosis not present

## 2021-04-27 DIAGNOSIS — M9905 Segmental and somatic dysfunction of pelvic region: Secondary | ICD-10-CM | POA: Diagnosis not present

## 2021-04-27 DIAGNOSIS — M9902 Segmental and somatic dysfunction of thoracic region: Secondary | ICD-10-CM | POA: Diagnosis not present

## 2021-04-27 DIAGNOSIS — M9901 Segmental and somatic dysfunction of cervical region: Secondary | ICD-10-CM | POA: Diagnosis not present

## 2021-04-27 DIAGNOSIS — M2560 Stiffness of unspecified joint, not elsewhere classified: Secondary | ICD-10-CM | POA: Diagnosis not present

## 2021-05-04 DIAGNOSIS — M9905 Segmental and somatic dysfunction of pelvic region: Secondary | ICD-10-CM | POA: Diagnosis not present

## 2021-05-04 DIAGNOSIS — M9901 Segmental and somatic dysfunction of cervical region: Secondary | ICD-10-CM | POA: Diagnosis not present

## 2021-05-04 DIAGNOSIS — M5137 Other intervertebral disc degeneration, lumbosacral region: Secondary | ICD-10-CM | POA: Diagnosis not present

## 2021-05-04 DIAGNOSIS — M2560 Stiffness of unspecified joint, not elsewhere classified: Secondary | ICD-10-CM | POA: Diagnosis not present

## 2021-05-04 DIAGNOSIS — M9902 Segmental and somatic dysfunction of thoracic region: Secondary | ICD-10-CM | POA: Diagnosis not present

## 2021-05-04 DIAGNOSIS — M9903 Segmental and somatic dysfunction of lumbar region: Secondary | ICD-10-CM | POA: Diagnosis not present

## 2021-05-06 DIAGNOSIS — M9901 Segmental and somatic dysfunction of cervical region: Secondary | ICD-10-CM | POA: Diagnosis not present

## 2021-05-06 DIAGNOSIS — M9903 Segmental and somatic dysfunction of lumbar region: Secondary | ICD-10-CM | POA: Diagnosis not present

## 2021-05-06 DIAGNOSIS — M2560 Stiffness of unspecified joint, not elsewhere classified: Secondary | ICD-10-CM | POA: Diagnosis not present

## 2021-05-06 DIAGNOSIS — M9905 Segmental and somatic dysfunction of pelvic region: Secondary | ICD-10-CM | POA: Diagnosis not present

## 2021-05-06 DIAGNOSIS — M9902 Segmental and somatic dysfunction of thoracic region: Secondary | ICD-10-CM | POA: Diagnosis not present

## 2021-05-06 DIAGNOSIS — M5137 Other intervertebral disc degeneration, lumbosacral region: Secondary | ICD-10-CM | POA: Diagnosis not present

## 2021-05-16 DIAGNOSIS — M5137 Other intervertebral disc degeneration, lumbosacral region: Secondary | ICD-10-CM | POA: Diagnosis not present

## 2021-05-16 DIAGNOSIS — M9905 Segmental and somatic dysfunction of pelvic region: Secondary | ICD-10-CM | POA: Diagnosis not present

## 2021-05-16 DIAGNOSIS — M62830 Muscle spasm of back: Secondary | ICD-10-CM | POA: Diagnosis not present

## 2021-05-16 DIAGNOSIS — M9903 Segmental and somatic dysfunction of lumbar region: Secondary | ICD-10-CM | POA: Diagnosis not present

## 2021-05-16 DIAGNOSIS — M9902 Segmental and somatic dysfunction of thoracic region: Secondary | ICD-10-CM | POA: Diagnosis not present

## 2021-05-18 DIAGNOSIS — M5137 Other intervertebral disc degeneration, lumbosacral region: Secondary | ICD-10-CM | POA: Diagnosis not present

## 2021-05-18 DIAGNOSIS — M9905 Segmental and somatic dysfunction of pelvic region: Secondary | ICD-10-CM | POA: Diagnosis not present

## 2021-05-18 DIAGNOSIS — M546 Pain in thoracic spine: Secondary | ICD-10-CM | POA: Diagnosis not present

## 2021-05-18 DIAGNOSIS — M9902 Segmental and somatic dysfunction of thoracic region: Secondary | ICD-10-CM | POA: Diagnosis not present

## 2021-05-18 DIAGNOSIS — M62830 Muscle spasm of back: Secondary | ICD-10-CM | POA: Diagnosis not present

## 2021-05-18 DIAGNOSIS — M2560 Stiffness of unspecified joint, not elsewhere classified: Secondary | ICD-10-CM | POA: Diagnosis not present

## 2021-05-18 DIAGNOSIS — M9903 Segmental and somatic dysfunction of lumbar region: Secondary | ICD-10-CM | POA: Diagnosis not present

## 2021-05-19 ENCOUNTER — Ambulatory Visit (INDEPENDENT_AMBULATORY_CARE_PROVIDER_SITE_OTHER): Payer: BC Managed Care – PPO | Admitting: Internal Medicine

## 2021-05-19 ENCOUNTER — Encounter: Payer: Self-pay | Admitting: Internal Medicine

## 2021-05-19 ENCOUNTER — Other Ambulatory Visit: Payer: Self-pay

## 2021-05-19 VITALS — BP 126/82 | HR 88 | Temp 98.0°F | Resp 16 | Ht 64.0 in | Wt 174.0 lb

## 2021-05-19 DIAGNOSIS — Z0000 Encounter for general adult medical examination without abnormal findings: Secondary | ICD-10-CM | POA: Diagnosis not present

## 2021-05-19 DIAGNOSIS — F331 Major depressive disorder, recurrent, moderate: Secondary | ICD-10-CM | POA: Diagnosis not present

## 2021-05-19 DIAGNOSIS — F411 Generalized anxiety disorder: Secondary | ICD-10-CM | POA: Diagnosis not present

## 2021-05-19 LAB — CBC WITH DIFFERENTIAL/PLATELET
Basophils Absolute: 0 10*3/uL (ref 0.0–0.1)
Basophils Relative: 0.3 % (ref 0.0–3.0)
Eosinophils Absolute: 0.1 10*3/uL (ref 0.0–0.7)
Eosinophils Relative: 1.2 % (ref 0.0–5.0)
HCT: 38.4 % (ref 36.0–46.0)
Hemoglobin: 12.7 g/dL (ref 12.0–15.0)
Lymphocytes Relative: 16.9 % (ref 12.0–46.0)
Lymphs Abs: 1.7 10*3/uL (ref 0.7–4.0)
MCHC: 33 g/dL (ref 30.0–36.0)
MCV: 87 fl (ref 78.0–100.0)
Monocytes Absolute: 0.6 10*3/uL (ref 0.1–1.0)
Monocytes Relative: 5.7 % (ref 3.0–12.0)
Neutro Abs: 7.4 10*3/uL (ref 1.4–7.7)
Neutrophils Relative %: 75.9 % (ref 43.0–77.0)
Platelets: 220 10*3/uL (ref 150.0–400.0)
RBC: 4.42 Mil/uL (ref 3.87–5.11)
RDW: 14.4 % (ref 11.5–15.5)
WBC: 9.8 10*3/uL (ref 4.0–10.5)

## 2021-05-19 LAB — BASIC METABOLIC PANEL
BUN: 12 mg/dL (ref 6–23)
CO2: 28 mEq/L (ref 19–32)
Calcium: 9.1 mg/dL (ref 8.4–10.5)
Chloride: 106 mEq/L (ref 96–112)
Creatinine, Ser: 0.69 mg/dL (ref 0.40–1.20)
GFR: 112.06 mL/min (ref >60.00)
Glucose, Bld: 90 mg/dL (ref 70–99)
Potassium: 4.3 mEq/L (ref 3.5–5.1)
Sodium: 139 mEq/L (ref 135–145)

## 2021-05-19 LAB — HEPATIC FUNCTION PANEL
ALT: 17 U/L (ref 0–35)
AST: 19 U/L (ref 0–37)
Albumin: 4.2 g/dL (ref 3.5–5.2)
Alkaline Phosphatase: 65 U/L (ref 39–117)
Bilirubin, Direct: 0.1 mg/dL (ref 0.0–0.3)
Total Bilirubin: 0.6 mg/dL (ref 0.2–1.2)
Total Protein: 6.9 g/dL (ref 6.0–8.3)

## 2021-05-19 LAB — TSH: TSH: 1.15 u[IU]/mL (ref 0.35–5.50)

## 2021-05-19 MED ORDER — DULOXETINE HCL 30 MG PO CPEP: 30.0000 mg | ORAL_CAPSULE | Freq: Every day | ORAL | 0 refills | Status: DC

## 2021-05-19 NOTE — Progress Notes (Addendum)
? ?Subjective:  ?Patient ID: Kaylee Hunt, female    DOB: [birth year]  Age: 36 y.o. MRN: [id number] ? ?CC: Annual Exam, Depression, and Back Pain ? ?This visit occurred during the SARS-CoV-2 public health emergency.  Safety protocols were in place, including screening questions prior to the visit, additional usage of staff PPE, and extensive cleaning of exam room while observing appropriate contact time as indicated for disinfecting solutions.   ? ?HPI ?Nedda Gains presents for a CPX and f/up -  ? ?She is no longer taking trazodone.  She said it helped with anxiety and insomnia but gave her a hangover the next day.  She continues to struggle with signs and symptoms of depression and would like to try something different. ? ?Outpatient Medications Prior to Visit  ?Medication Sig Dispense Refill  ? benzonatate (TESSALON) 100 MG capsule Take 2 capsules (200 mg total) by mouth every 8 (eight) hours. 21 capsule 0  ? ipratropium (ATROVENT) 0.06 % nasal spray Place 2 sprays into both nostrils 4 (four) times daily. 15 mL 12  ? promethazine-dextromethorphan (PROMETHAZINE-DM) 6.25-15 MG/5ML syrup Take 5 mLs by mouth 4 (four) times daily as needed. 118 mL 0  ? ?No facility-administered medications prior to visit.  ? ? ?ROS ?Review of Systems  ?Constitutional:  Positive for fatigue and unexpected weight change (wt gain). Negative for chills and diaphoresis.  ?Eyes: Negative.   ?Respiratory: Negative.  Negative for cough, shortness of breath and wheezing.   ?Cardiovascular:  Negative for chest pain, palpitations and leg swelling.  ?Gastrointestinal:  Negative for abdominal pain, constipation, diarrhea and nausea.  ?Endocrine: Negative.   ?Genitourinary: Negative.  Negative for difficulty urinating.  ?Musculoskeletal: Negative.   ?Skin: Negative.   ?Neurological: Negative.  Negative for dizziness.  ?Hematological: Negative.   ?Psychiatric/Behavioral:  Positive for dysphoric mood and sleep disturbance. Negative for agitation,  behavioral problems, confusion, decreased concentration, hallucinations, self-injury and suicidal ideas. The patient is nervous/anxious. The patient is not hyperactive.   ? ?Objective:  ?BP 126/82 (BP Location: Right Arm, Patient Position: Sitting, Cuff Size: Large)   Pulse 88   Temp 98 ?F (36.7 ?C) (Oral)   Resp 16   Ht '5\' 4"'$  (1.626 m)   Wt 174 lb (78.9 kg)   LMP 04/25/2021 (Approximate)   SpO2 99%   BMI 29.87 kg/m?  ? ?BP Readings from Last 3 Encounters:  ?05/19/21 126/82  ?04/09/21 118/74  ?02/02/21 112/70  ? ? ?Wt Readings from Last 3 Encounters:  ?05/19/21 174 lb (78.9 kg)  ?04/09/21 130 lb (59 kg)  ?08/17/20 144 lb (65.3 kg)  ? ? ?Physical Exam ?Vitals reviewed.  ?Constitutional:   ?   Appearance: She is not ill-appearing.  ?HENT:  ?   Mouth/Throat:  ?   Mouth: Mucous membranes are moist.  ?Eyes:  ?   General: No scleral icterus. ?   Conjunctiva/sclera: Conjunctivae normal.  ?Cardiovascular:  ?   Rate and Rhythm: Normal rate and regular rhythm.  ?   Heart sounds: No murmur heard. ?Pulmonary:  ?   Effort: Pulmonary effort is normal.  ?   Breath sounds: No stridor. No wheezing, rhonchi or rales.  ?Abdominal:  ?   General: Abdomen is flat.  ?   Palpations: There is no mass.  ?   Tenderness: There is no abdominal tenderness. There is no guarding.  ?   Hernia: No hernia is present.  ?Musculoskeletal:     ?   General: Normal range of motion.  ?   Cervical  back: Neck supple.  ?   Right lower leg: No edema.  ?   Left lower leg: No edema.  ?Lymphadenopathy:  ?   Cervical: No cervical adenopathy.  ?Skin: ?   General: Skin is warm and dry.  ?Neurological:  ?   General: No focal deficit present.  ?   Mental Status: She is alert.  ?Psychiatric:     ?   Attention and Perception: Attention normal.     ?   Mood and Affect: Mood is depressed. Mood is not anxious. Affect is flat. Affect is not tearful.     ?   Speech: Speech normal.     ?   Behavior: Behavior normal. Behavior is cooperative.     ?   Thought Content:  Thought content is not paranoid or delusional. Thought content does not include homicidal or suicidal ideation.  ? ? ?Lab Results  ?Component Value Date  ? WBC 9.8 05/19/2021  ? HGB 12.7 05/19/2021  ? HCT 38.4 05/19/2021  ? PLT 220.0 05/19/2021  ? GLUCOSE 90 05/19/2021  ? CHOL 118 05/13/2020  ? TRIG 40.0 05/13/2020  ? HDL 60.80 05/13/2020  ? LDLCALC 49 05/13/2020  ? ALT 17 05/19/2021  ? AST 19 05/19/2021  ? NA 139 05/19/2021  ? K 4.3 05/19/2021  ? CL 106 05/19/2021  ? CREATININE 0.69 05/19/2021  ? BUN 12 05/19/2021  ? CO2 28 05/19/2021  ? TSH 1.15 05/19/2021  ? ? ?No results found. ? ?Assessment & Plan:  ? ?Pattye was seen today for annual exam, depression and back pain. ? ?Diagnoses and all orders for this visit: ? ?Routine general medical examination at a health care facility- Exam completed, labs reviewed, vaccines are up-to-date, cancer screenings are up-to-date, patient education was given. ? ?Moderate episode of recurrent major depressive disorder ([place])- Labs are reassuring.  I recommended that she start taking duloxetine. ?-     DULoxetine (CYMBALTA) 30 MG capsule; Take 1 capsule (30 mg total) by mouth daily. ?-     Basic metabolic panel; Future ?-     Hepatic function panel; Future ?-     TSH; Future ?-     CBC with Differential/Platelet; Future ?-     CBC with Differential/Platelet ?-     TSH ?-     Hepatic function panel ?-     Basic metabolic panel ? ? ?I have discontinued Disney Smith's benzonatate, ipratropium, and promethazine-dextromethorphan. I am also having her start on DULoxetine. ? ?Meds ordered this encounter  ?Medications  ? DULoxetine (CYMBALTA) 30 MG capsule  ?  Sig: Take 1 capsule (30 mg total) by mouth daily.  ?  Dispense:  30 capsule  ?  Refill:  0  ? ? ? ?Follow-up: Return in about 6 months (around 11/19/2021). ? ?[name], MD ?

## 2021-05-19 NOTE — Patient Instructions (Signed)
Major Depressive Disorder, Adult °Major depressive disorder (MDD) is a mental health condition. It may also be called clinical depression or unipolar depression. MDD causes symptoms of sadness, hopelessness, and loss of interest in things. These symptoms last most of the day, almost every day, for 2 weeks. MDD can also cause physical symptoms. It can interfere with relationships and with everyday activities, such as work, school, and activities that are usually pleasant. °MDD may be mild, moderate, or severe. It may be single-episode MDD, which happens once, or recurrent MDD, which may occur multiple times. °What are the causes? °The exact cause of this condition is not known. MDD is most likely caused by a combination of things, which may include: °Your personality traits. °Learned or conditioned behaviors or thoughts or feelings that reinforce negativity. °Any alcohol or substance misuse. °Long-term (chronic) physical or mental health illness. °Going through a traumatic experience or major life changes. °What increases the risk? °The following factors may make someone more likely to develop MDD: °A family history of depression. °Being a woman. °Troubled family relationships. °Abnormally low levels of certain brain chemicals. °Traumatic or painful events in childhood, especially abuse or loss of a parent. °A lot of stress from life experiences, such as poor living conditions or discrimination. °Chronic physical illness or other mental health disorders. °What are the signs or symptoms? °The main symptoms of MDD usually include: °Constant depressed or irritable mood. °A loss of interest in things and activities. °Other symptoms include: °Sleeping or eating too much or too little. °Unexplained weight gain or weight loss. °Tiredness or low energy. °Being agitated, restless, or weak. °Feeling hopeless, worthless, or guilty. °Trouble thinking clearly or making decisions. °Thoughts of suicide or thoughts of harming  others. °Isolating oneself or avoiding other people or activities. °Trouble completing tasks, work, or any normal obligations. °Severe symptoms of this condition may include: °Psychotic depression.This may include false beliefs, or delusions. It may also include seeing, hearing, tasting, smelling, or feeling things that are not real (hallucinations). °Chronic depression or persistent depressive disorder. This is low-level depression that lasts for at least 2 years. °Melancholic depression, or feeling extremely sad and hopeless. °Catatonic depression, which includes trouble speaking and trouble moving. °How is this diagnosed? °This condition may be diagnosed based on: °Your symptoms. °Your medical and mental health history. You may be asked questions about your lifestyle, including any drug and alcohol use. °A physical exam. °Blood tests to rule out other conditions. °MDD is confirmed if you have the following symptoms most of the day, nearly every day, in a 2-week period: °Either a depressed mood or loss of interest. °At least four other MDD symptoms. °How is this treated? °This condition is usually treated by mental health professionals, such as psychologists, psychiatrists, and clinical social workers. You may need more than one type of treatment. Treatment may include: °Psychotherapy, also called talk therapy or counseling. Types of psychotherapy include: °Cognitive behavioral therapy (CBT). This teaches you to recognize unhealthy feelings, thoughts, and behaviors, and replace them with positive thoughts and actions. °Interpersonal therapy (IPT). This helps you to improve the way you communicate with others or relate to them. °Family therapy. This treatment includes members of your family. °Medicines to treat anxiety and depression. These medicines help to balance the brain chemicals that affect your emotions. °Lifestyle changes. You may be asked to: °Limit alcohol use and avoid drug use. °Get regular  exercise. °Get plenty of sleep. °Make healthy eating choices. °Spend more time outdoors. °Brain stimulation. This may   be done if symptoms are very severe and other treatments have not worked. Examples of this treatment are electroconvulsive therapy and transcranial magnetic stimulation. °Follow these instructions at home: °Activity °Exercise regularly and spend time outdoors. °Find activities that you enjoy doing, and make time to do them. °Find healthy ways to manage stress, such as: °Meditation or deep breathing. °Spending time in nature. °Journaling. °Return to your normal activities as told by your health care provider. Ask your health care provider what activities are safe for you. °Alcohol and drug use °If you drink alcohol: °Limit how much you use to: °0-1 drink a day for women who are not pregnant. °0-2 drinks a day for men. °Be aware of how much alcohol is in your drink. In the [place] one drink equals one 12 oz bottle of beer (355 mL), one 5 oz glass of wine (148 mL), or one 1½ oz glass of hard liquor (44 mL). °Discuss your alcohol use with your health care provider. Alcohol can affect any antidepressant medicines you are taking. °Discuss any drug use with your health care provider. °General instructions ° °Take over-the-counter and prescription medicines only as told by your health care provider. °Eat a healthy diet and get plenty of sleep. °Consider joining a support group. Your health care provider may be able to recommend one. °Keep all follow-up visits as told by your health care provider. This is important. °Where to find more information °National Alliance on Mental Illness: www.nami.org °[place] National Institute of Mental Health: www.nimh.nih.gov °Contact a health care provider if: °Your symptoms get worse. °You develop new symptoms. °Get help right away if: °You self-harm. °You have serious thoughts about hurting yourself or others. °You hallucinate. °If you ever feel like you may hurt yourself or  others, or have thoughts about taking your own life, get help right away. Go to your nearest emergency department or: °Call your local emergency services (911 in the [place] °Call a suicide crisis helpline, such as the National Suicide Prevention Lifeline at 1-800-273-8255 or 988 in the [place] This is open 24 hours a day in the [place] °Text the Crisis Text Line at 741741 (in the [place] °Summary °Major depressive disorder (MDD) is a mental health condition. MDD causes symptoms of sadness, hopelessness, and loss of interest in things. These symptoms last most of the day, almost every day, for 2 weeks. °The symptoms of MDD can interfere with relationships and with everyday activities. °Treatments and support are available for people who develop MDD. You may need more than one type of treatment. °Get help right away if you have serious thoughts about hurting yourself or others. °This information is not intended to replace advice given to you by your health care provider. Make sure you discuss any questions you have with your health care provider. °Document Revised: 09/08/2020 Document Reviewed: 01/25/2019 °Elsevier Patient Education © 2022 [employer] Inc. ° °

## 2021-06-08 DIAGNOSIS — G8929 Other chronic pain: Secondary | ICD-10-CM | POA: Diagnosis not present

## 2021-06-08 DIAGNOSIS — M545 Low back pain, unspecified: Secondary | ICD-10-CM | POA: Diagnosis not present

## 2021-06-09 ENCOUNTER — Other Ambulatory Visit: Payer: Self-pay | Admitting: Internal Medicine

## 2021-06-09 ENCOUNTER — Encounter: Payer: Self-pay | Admitting: Internal Medicine

## 2021-06-10 ENCOUNTER — Encounter: Payer: Self-pay | Admitting: Internal Medicine

## 2021-06-10 MED ORDER — CLONAZEPAM 0.5 MG PO TABS: 0.5000 mg | ORAL_TABLET | Freq: Three times a day (TID) | ORAL | 1 refills | Status: DC | PRN

## 2021-06-10 NOTE — Addendum Note (Signed)
Addended by: Janith Lima on: 06/10/2021 08:37 AM ? ? Modules accepted: Orders ? ?

## 2021-06-15 DIAGNOSIS — M47816 Spondylosis without myelopathy or radiculopathy, lumbar region: Secondary | ICD-10-CM | POA: Diagnosis not present

## 2021-06-15 DIAGNOSIS — M545 Low back pain, unspecified: Secondary | ICD-10-CM | POA: Diagnosis not present

## 2021-06-16 ENCOUNTER — Encounter: Payer: Self-pay | Admitting: Nurse Practitioner

## 2021-06-16 ENCOUNTER — Ambulatory Visit (INDEPENDENT_AMBULATORY_CARE_PROVIDER_SITE_OTHER): Payer: BC Managed Care – PPO | Admitting: Nurse Practitioner

## 2021-06-16 VITALS — BP 116/74 | Ht 64.0 in | Wt 174.0 lb

## 2021-06-16 DIAGNOSIS — Z01419 Encounter for gynecological examination (general) (routine) without abnormal findings: Secondary | ICD-10-CM | POA: Diagnosis not present

## 2021-06-16 DIAGNOSIS — Z803 Family history of malignant neoplasm of breast: Secondary | ICD-10-CM | POA: Diagnosis not present

## 2021-06-16 DIAGNOSIS — Z87410 Personal history of cervical dysplasia: Secondary | ICD-10-CM

## 2021-06-16 DIAGNOSIS — Z113 Encounter for screening for infections with a predominantly sexual mode of transmission: Secondary | ICD-10-CM | POA: Diagnosis not present

## 2021-06-16 NOTE — Progress Notes (Signed)
? ?  Kaylee Hunt 1985/11/13 [id number] ? ? ?History:  36 y.o. [id number] presents for annual visit. 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 Was on Depo, last dose in December 2022. Had menses in February. Not interested in contraception at this time. Anxiety/depression managed by PCP. History of fibroadenoma. Tried to set up screening mammogram but had trouble getting records.  ? ?Gynecologic History ?Patient's last menstrual period was 04/13/2021. ?Period Duration (Days): 7 ?Period Pattern: (!) Irregular ?Menstrual Flow: Moderate, Light ?Dysmenorrhea: (!) Moderate ?Dysmenorrhea Symptoms: Cramping ?Contraception/Family planning: none ?Sexually active: Yes ? ?Health Maintenance ?Last Pap: 02/02/2021. Results were: Normal ?Last mammogram: 7-8 years ago ?Last colonoscopy: Not indicated ?Last Dexa: Not indicated ? ?Past medical history, past surgical history, family history and social history were all reviewed and documented in the EPIC chart. Mother diagnosed with stage 4 breast cancer at age 69. 70 yo daughter, in 7th grade, plays flute. 6 yo son, in kindergarten, playing T-ball. Works at [employer].  ? ?ROS:  A ROS was performed and pertinent positives and negatives are included. ? ?Exam: ? ?Vitals:  ? 06/16/21 0757  ?BP: 116/74  ?Weight: 174 lb (78.9 kg)  ?Height: '5\' 4"'$  (1.626 m)  ? ? ?Body mass index is 29.87 kg/m?. ? ?General appearance:  Normal ?Thyroid:  Symmetrical, normal in size, without palpable masses or nodularity. ?Respiratory ? Auscultation:  Clear without wheezing or rhonchi ?Cardiovascular ? Auscultation:  Regular rate, without rubs, murmurs or gallops ? Edema/varicosities:  Not grossly evident ?Abdominal ? Soft,nontender, without masses, guarding or rebound. ? Liver/spleen:  No organomegaly noted ? Hernia:  None appreciated ? Skin ? Inspection:  Grossly normal ?  ?Breasts: Examined lying and sitting.  ? Right: Without masses, retractions, discharge or axillary adenopathy. ? ? Left: Without  masses, retractions, discharge or axillary adenopathy. ?Genitourinary  ? Inguinal/mons:  Normal without inguinal adenopathy ? External genitalia:  Normal appearing vulva with no masses, tenderness, or lesions ? BUS/Urethra/Skene's glands:  Normal ? Vagina:  Normal appearing with normal color and discharge, no lesions ? Cervix:  Normal appearing without discharge or lesions ? Uterus:  Normal in size, shape and contour.  Midline and mobile, nontender ? Adnexa/parametria:   ?  Rt: Normal in size, without masses or tenderness. ?  Lt: Normal in size, without masses or tenderness. ? Anus and perineum: Normal ? Digital rectal exam: Not indicated ? ?Patient informed chaperone available to be present for breast and pelvic exam. Patient has requested no chaperone to be present. Patient has been advised what will be completed during breast and pelvic exam.  ? ?Assessment/Plan:  36 y.o. [id number] to establish care.  ? ?Well female exam with routine gynecological exam - Education provided on SBEs, importance of preventative screenings, current guidelines, high calcium diet, regular exercise, and multivitamin daily. Labs with PCP.  ? 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 Normal pap 01/2021. Will repeat in December.  ? ?Family history of breast cancer in first degree relative - Mother diagnosed with stage 4 breast cancer at age 36. Discussed importance of annual screenings. She tried to obtain records from previous imaging center but was not able to get. Plans to reach out again. Normal breast exam today.  ? ?Screen for STD (sexually transmitted disease) - Plan: SURESWAB CT/NG/T. vaginalis, RPR, HIV Antibody (routine testing w rflx) ? ?Return in December for repeat pap. ? ? ? ?Tamela Gammon DNP, 8:26 AM 06/16/2021 ? ?

## 2021-06-17 LAB — SURESWAB CT/NG/T. VAGINALIS
C. trachomatis RNA, TMA: NOT DETECTED
N. gonorrhoeae RNA, TMA: NOT DETECTED
Trichomonas vaginalis RNA: NOT DETECTED

## 2021-06-17 LAB — HIV ANTIBODY (ROUTINE TESTING W REFLEX): HIV 1&2 Ab, 4th Generation: NONREACTIVE

## 2021-06-17 LAB — RPR: RPR Ser Ql: NONREACTIVE

## 2021-06-22 ENCOUNTER — Encounter: Payer: Self-pay | Admitting: Internal Medicine

## 2021-07-01 DIAGNOSIS — M545 Low back pain, unspecified: Secondary | ICD-10-CM | POA: Diagnosis not present

## 2021-07-01 DIAGNOSIS — G8929 Other chronic pain: Secondary | ICD-10-CM | POA: Diagnosis not present

## 2021-07-06 DIAGNOSIS — Z1231 Encounter for screening mammogram for malignant neoplasm of breast: Secondary | ICD-10-CM | POA: Diagnosis not present

## 2021-07-06 LAB — HM MAMMOGRAPHY

## 2021-07-08 DIAGNOSIS — M7918 Myalgia, other site: Secondary | ICD-10-CM | POA: Diagnosis not present

## 2021-07-08 DIAGNOSIS — M533 Sacrococcygeal disorders, not elsewhere classified: Secondary | ICD-10-CM | POA: Diagnosis not present

## 2021-07-08 DIAGNOSIS — M47816 Spondylosis without myelopathy or radiculopathy, lumbar region: Secondary | ICD-10-CM | POA: Diagnosis not present

## 2021-07-08 DIAGNOSIS — G8929 Other chronic pain: Secondary | ICD-10-CM | POA: Diagnosis not present

## 2021-07-13 DIAGNOSIS — R928 Other abnormal and inconclusive findings on diagnostic imaging of breast: Secondary | ICD-10-CM | POA: Diagnosis not present

## 2021-07-13 DIAGNOSIS — N6311 Unspecified lump in the right breast, upper outer quadrant: Secondary | ICD-10-CM | POA: Diagnosis not present

## 2021-08-18 DIAGNOSIS — M533 Sacrococcygeal disorders, not elsewhere classified: Secondary | ICD-10-CM | POA: Diagnosis not present

## 2021-10-14 DIAGNOSIS — L739 Follicular disorder, unspecified: Secondary | ICD-10-CM | POA: Diagnosis not present

## 2021-10-20 ENCOUNTER — Encounter: Payer: Self-pay | Admitting: Emergency Medicine

## 2021-10-20 ENCOUNTER — Ambulatory Visit
Admission: EM | Admit: 2021-10-20 | Discharge: 2021-10-20 | Disposition: A | Discharge status: Home / Self Care | Payer: BC Managed Care – PPO | Attending: Physician Assistant | Admitting: Physician Assistant

## 2021-10-20 ENCOUNTER — Other Ambulatory Visit: Payer: Self-pay

## 2021-10-20 DIAGNOSIS — R0981 Nasal congestion: Secondary | ICD-10-CM | POA: Insufficient documentation

## 2021-10-20 DIAGNOSIS — Z20822 Contact with and (suspected) exposure to covid-19: Secondary | ICD-10-CM | POA: Diagnosis not present

## 2021-10-20 DIAGNOSIS — R051 Acute cough: Secondary | ICD-10-CM | POA: Diagnosis not present

## 2021-10-20 DIAGNOSIS — J069 Acute upper respiratory infection, unspecified: Secondary | ICD-10-CM | POA: Diagnosis not present

## 2021-10-20 LAB — SARS CORONAVIRUS 2 BY RT PCR: SARS Coronavirus 2 by RT PCR: NEGATIVE

## 2021-10-20 MED ORDER — PSEUDOEPH-BROMPHEN-DM 30-2-10 MG/5ML PO SYRP: 10.0000 mL | ORAL_SOLUTION | Freq: Four times a day (QID) | ORAL | 0 refills | Status: AC | PRN

## 2021-10-20 NOTE — ED Triage Notes (Signed)
Pt c/o cough, nasal congestion, sputum production. Started about a week ago. Denies fever. Pt is currently taking keflex for an abscess.

## 2021-10-20 NOTE — Discharge Instructions (Addendum)
-  Will call if COVID is positive.   URI/COLD SYMPTOMS: Your exam today is consistent with a viral illness. Antibiotics are not indicated at this time. Use medications as directed, including cough syrup, nasal saline, and decongestants. Your symptoms should improve over the next few days and resolve within 7-10 days. Increase rest and fluids. F/u if symptoms worsen or predominate such as sore throat, ear pain, productive cough, shortness of breath, or if you develop high fevers or worsening fatigue over the next several days.
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
## 2021-10-28 DIAGNOSIS — M533 Sacrococcygeal disorders, not elsewhere classified: Secondary | ICD-10-CM | POA: Diagnosis not present

## 2021-10-28 DIAGNOSIS — M47816 Spondylosis without myelopathy or radiculopathy, lumbar region: Secondary | ICD-10-CM | POA: Diagnosis not present

## 2021-10-28 DIAGNOSIS — M7918 Myalgia, other site: Secondary | ICD-10-CM | POA: Diagnosis not present

## 2021-10-28 DIAGNOSIS — G894 Chronic pain syndrome: Secondary | ICD-10-CM | POA: Diagnosis not present

## 2021-11-15 DIAGNOSIS — R109 Unspecified abdominal pain: Secondary | ICD-10-CM | POA: Diagnosis not present

## 2021-11-15 DIAGNOSIS — R1030 Lower abdominal pain, unspecified: Secondary | ICD-10-CM | POA: Diagnosis not present

## 2021-11-15 DIAGNOSIS — R112 Nausea with vomiting, unspecified: Secondary | ICD-10-CM | POA: Diagnosis not present

## 2021-11-25 DIAGNOSIS — F4312 Post-traumatic stress disorder, chronic: Secondary | ICD-10-CM | POA: Diagnosis not present

## 2021-11-25 DIAGNOSIS — M47816 Spondylosis without myelopathy or radiculopathy, lumbar region: Secondary | ICD-10-CM | POA: Diagnosis not present

## 2021-11-25 DIAGNOSIS — F4323 Adjustment disorder with mixed anxiety and depressed mood: Secondary | ICD-10-CM | POA: Diagnosis not present

## 2021-11-27 DIAGNOSIS — I10 Essential (primary) hypertension: Secondary | ICD-10-CM | POA: Diagnosis not present

## 2021-11-30 ENCOUNTER — Ambulatory Visit (INDEPENDENT_AMBULATORY_CARE_PROVIDER_SITE_OTHER): Payer: BC Managed Care – PPO | Admitting: Nurse Practitioner

## 2021-11-30 ENCOUNTER — Encounter: Payer: Self-pay | Admitting: Nurse Practitioner

## 2021-11-30 VITALS — BP 134/80

## 2021-11-30 DIAGNOSIS — N898 Other specified noninflammatory disorders of vagina: Secondary | ICD-10-CM | POA: Diagnosis not present

## 2021-11-30 DIAGNOSIS — R300 Dysuria: Secondary | ICD-10-CM

## 2021-11-30 DIAGNOSIS — Z113 Encounter for screening for infections with a predominantly sexual mode of transmission: Secondary | ICD-10-CM

## 2021-11-30 LAB — WET PREP FOR TRICH, YEAST, CLUE

## 2021-11-30 NOTE — Progress Notes (Signed)
   Acute Office Visit  Subjective:    Patient ID: Kaylee Hunt, female    DOB: [birth year], 36 y.o.   MRN: [id number]   HPI 36 y.o. presents today for vaginal discharge and burning with urination. Reports husband had 2 lesions on penis recently that resolved. This happened in the past and they resolved on their own then too. Unsure if they were painful. Would like HSV checked today. Declines other STD screening today.    Review of Systems  Constitutional: Negative.   Genitourinary:  Positive for dysuria and vaginal discharge. Negative for difficulty urinating, frequency, genital sores, hematuria, urgency and vaginal pain.       Objective:    Physical Exam Constitutional:      Appearance: Normal appearance.  Genitourinary:    General: Normal vulva.     Vagina: Normal.     Cervix: Normal.     BP 134/80 (BP Location: Right Arm, Patient Position: Sitting, Cuff Size: Normal)   LMP 11/23/2021  Wt Readings from Last 3 Encounters:  10/20/21 173 lb 15.1 oz (78.9 kg)  06/16/21 174 lb (78.9 kg)  05/19/21 174 lb (78.9 kg)        Patient informed chaperone available to be present for breast and/or pelvic exam. Patient has requested no chaperone to be present. Patient has been advised what will be completed during breast and pelvic exam.   UA negative Wet prep negative  Assessment & Plan:   Problem List Items Addressed This Visit   None Visit Diagnoses     Vaginal discharge    -  Primary   Relevant Orders   WET PREP FOR [place], YEAST, CLUE   Screening examination for STD (sexually transmitted disease)       Relevant Orders   HSV(herpes simplex vrs) 1+2 ab-IgG   Burning with urination       Relevant Orders   Urinalysis,Complete w/RFL Culture   WET PREP FOR [place], YEAST, CLUE      Plan: Negative UA, wet prep, and exam today. Will check HSV due to unknown lesion of husband. We discussed nature of HSV and when to manage. She voiced understanding.      Tamela Gammon  DNP, 2:02 PM 11/30/2021

## 2021-12-01 LAB — HSV(HERPES SIMPLEX VRS) I + II AB-IGG
HAV 1 IGG,TYPE SPECIFIC AB: 3.76 index — ABNORMAL HIGH
HSV 2 IGG,TYPE SPECIFIC AB: 11.8 index — ABNORMAL HIGH

## 2021-12-08 DIAGNOSIS — F4323 Adjustment disorder with mixed anxiety and depressed mood: Secondary | ICD-10-CM | POA: Diagnosis not present

## 2021-12-08 DIAGNOSIS — F4312 Post-traumatic stress disorder, chronic: Secondary | ICD-10-CM | POA: Diagnosis not present

## 2021-12-22 LAB — URINALYSIS, COMPLETE W/RFL CULTURE
Bacteria, UA: NONE SEEN /HPF
Bilirubin Urine: NEGATIVE
Casts: NONE SEEN /LPF
Crystals: NONE SEEN /HPF
Glucose, UA: NEGATIVE
Hgb urine dipstick: NEGATIVE
Hyaline Cast: NONE SEEN /LPF
Ketones, ur: NEGATIVE
Leukocyte Esterase: NEGATIVE
Nitrites, Initial: NEGATIVE
Protein, ur: NEGATIVE
RBC / HPF: NONE SEEN /HPF (ref 0–2)
Specific Gravity, Urine: 1.005 (ref 1.001–1.035)
WBC, UA: NONE SEEN /HPF (ref 0–5)
Yeast: NONE SEEN /HPF
pH: 5.5 (ref 5.0–8.0)

## 2021-12-22 LAB — NO CULTURE INDICATED

## 2021-12-28 DIAGNOSIS — I10 Essential (primary) hypertension: Secondary | ICD-10-CM | POA: Diagnosis not present

## 2021-12-30 DIAGNOSIS — M47816 Spondylosis without myelopathy or radiculopathy, lumbar region: Secondary | ICD-10-CM | POA: Diagnosis not present

## 2022-01-05 ENCOUNTER — Encounter: Payer: Self-pay | Admitting: Internal Medicine

## 2022-01-05 DIAGNOSIS — R928 Other abnormal and inconclusive findings on diagnostic imaging of breast: Secondary | ICD-10-CM | POA: Diagnosis not present

## 2022-01-11 ENCOUNTER — Ambulatory Visit (INDEPENDENT_AMBULATORY_CARE_PROVIDER_SITE_OTHER): Payer: BC Managed Care – PPO | Admitting: Internal Medicine

## 2022-01-11 ENCOUNTER — Encounter: Payer: Self-pay | Admitting: Internal Medicine

## 2022-01-11 VITALS — BP 128/86 | HR 90 | Temp 98.2°F | Resp 16 | Wt 180.0 lb

## 2022-01-11 DIAGNOSIS — M5416 Radiculopathy, lumbar region: Secondary | ICD-10-CM | POA: Insufficient documentation
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
## 2022-01-11 NOTE — Patient Instructions (Signed)
Managing Chronic Back Pain Chronic back pain is back pain that lasts for 12 weeks or longer. It often affects the lower back. Back pain may feel like a muscle ache or a sharp, stabbing pain. It can be mild, moderate, or severe. If you have been diagnosed with chronic back pain, there are things you can do to manage your symptoms. You may have to try different things to see what works best for you. Your health care provider may also give you specific instructions. How to manage lifestyle changes Treating chronic back pain often starts with rest and pain relief, followed by exercises to restore movement and strength to your back (physical therapy). You may need surgery if other treatments do not help, or if your pain is caused by a condition or an injury. Follow your treatment plan as told by your health care provider. This may include: Relaxation techniques. Talk therapy or counseling with a mental health specialist. A form of talk therapy called cognitive behavioral therapy (CBT) can be especially helpful. This therapy helps you set goals and follow up on the changes that you make. Acupuncture or massage therapy. Local electrical stimulation. Injections. These deliver numbing or pain-relieving medicines into your spine or the area of pain. How to recognize changes in your chronic back pain Your condition may improve with treatment. However, back pain may not go away or may get worse over time. Watch your symptoms carefully and let your health care provider know if your symptoms get worse or do not improve. Your back pain may be getting worse if you have: Pain that begins to cause problems with posture. Pain that gets worse when you are sitting, standing, walking, bending, or lifting. Pain that affects you while you are active, or at rest, or both. Pain that eventually makes it hard to move around (limits mobility). Pain that occurs with fever, weight loss, or difficulty urinating. Pain that causes  numbness and tingling. How to use body mechanics and posture to help with pain Healthy body mechanics and good posture can help to relieve stress on your back. Body mechanics refers to the movements and positions of your body during your daily activities. Posture is part of body mechanics. Good posture means: Your spine is in its natural S-curve, or neutral, position. Your shoulders are pulled back slightly. Your head is not tipped forward. Follow these guidelines to improve your posture and body mechanics in your everyday activities. Standing  When standing, keep your spine neutral and your feet about hip-width apart. Keep your knees slightly bent. Your ears, shoulders, and hips should line up. When you do a task in which you stand in one place for a long time, place one foot on a stable object that is 2-4 inches (5-10 cm) high, such as a footstool. This helps keep your spine neutral. Sitting  When sitting, keep your spine neutral and your feet flat on the floor. Use a footrest, if necessary, and keep your thighs parallel to the floor. Avoid rounding your shoulders, and avoid tilting your head forward. When working at a desk or a computer, keep your desk at a height where your hands are slightly lower than your elbows. Slide your chair under your desk so you are close enough to maintain good posture. When working at a computer, place your monitor at a height where you are looking straight ahead and you do not have to tilt your head forward or downward to view the screen. Lifting  Keep your feet at  least shoulder-width apart and tighten the muscles of your abdomen. Bend your knees and hips and keep your spine neutral. Be sure to lift using the strength of your legs, not your back. Do not lock your knees straight out. Always ask for help to lift heavy or awkward objects. Resting  When lying down and resting, avoid positions that are most painful. If you have pain with activities such as  sitting, bending, stooping, or squatting, lie in a position in which your body does not bend very much. For example, avoid curling up on your side with your arms and knees near your chest (fetal position). If you have pain with activities such as standing for a long time or reaching with your arms, lie with your spine in a neutral position and bend your knees slightly. Try: Lying on your side with a pillow between your knees. Lying on your back with a pillow under your knees. Follow these instructions at home: Medicines Treatment may include over-the-counter or prescription medicines for pain and inflammation that are taken by mouth or applied to the skin. Another treatment may include muscle relaxants. Take over-the-counter and prescription medicines only as told by your health care provider. Ask your health care provider if the medicine prescribed to you: Requires you to avoid driving or using machinery. Can cause constipation. You may need to take these actions to prevent or treat constipation: Drink enough fluid to keep your urine pale yellow. Take over-the-counter or prescription medicines. Eat foods that are high in fiber, such as beans, whole grains, and fresh fruits and vegetables. Limit foods that are high in fat and processed sugars, such as fried or sweet foods. Lifestyle Do not use any products that contain nicotine or tobacco, such as cigarettes, e-cigarettes, and chewing tobacco. If you need help quitting, ask your health care provider. Eat a healthy diet that includes foods such as vegetables, fruits, fish, and lean meats. Work with your health care provider to achieve or maintain a healthy weight. General instructions Get regular exercise as told. Exercise improves flexibility and strength. If physical therapy was prescribed, do exercises as told by your health care provider. Use ice or heat therapy as told by your health care provider. Keep all follow-up visits as told by your  health care provider. This is important. Where can I get support? Consider joining a support group for people managing chronic back pain. Ask your health care provider about support groups in your area. You can also find online and in-person support groups through: The American Chronic Pain Association: theacpa.org Pain Connection Program: painconnection.org Contact a health care provider if: You have pain that is not relieved with rest or medicine. Your pain gets worse, or you have new pain. You have a fever. You have rapid weight loss. You have trouble doing your normal activities. Get help right away if: You have weakness or numbness in one or both of your legs or feet. You have trouble controlling your bladder or your bowels. You have severe back pain and have any of the following: Nausea or vomiting. Abdominal pain. Shortness of breath or you faint. Summary Chronic back pain is often treated with rest, pain relief, and physical therapy. Talk therapy, acupuncture, massage, and local electrical stimulation may help. Follow your treatment plan as told by your health care provider. Joining a support group may help you manage chronic back pain. This information is not intended to replace advice given to you by your health care provider. Make sure you  discuss any questions you have with your health care provider. Document Revised: 03/27/2019 Document Reviewed: 12/03/2018 Elsevier Patient Education  [place].

## 2022-01-21 DIAGNOSIS — F4323 Adjustment disorder with mixed anxiety and depressed mood: Secondary | ICD-10-CM | POA: Diagnosis not present

## 2022-01-21 DIAGNOSIS — F4312 Post-traumatic stress disorder, chronic: Secondary | ICD-10-CM | POA: Diagnosis not present

## 2022-01-27 ENCOUNTER — Encounter: Payer: Self-pay | Admitting: Internal Medicine

## 2022-01-27 DIAGNOSIS — I10 Essential (primary) hypertension: Secondary | ICD-10-CM | POA: Diagnosis not present

## 2022-01-30 DIAGNOSIS — M47816 Spondylosis without myelopathy or radiculopathy, lumbar region: Secondary | ICD-10-CM | POA: Diagnosis not present

## 2022-02-10 DIAGNOSIS — F4323 Adjustment disorder with mixed anxiety and depressed mood: Secondary | ICD-10-CM | POA: Diagnosis not present

## 2022-02-10 DIAGNOSIS — F4312 Post-traumatic stress disorder, chronic: Secondary | ICD-10-CM | POA: Diagnosis not present

## 2022-02-16 ENCOUNTER — Encounter: Payer: Self-pay | Admitting: Nurse Practitioner

## 2022-02-16 ENCOUNTER — Other Ambulatory Visit (HOSPITAL_COMMUNITY)
Admission: RE | Admit: 2022-02-16 | Discharge: 2022-02-16 | Disposition: A | Discharge status: Home / Self Care | Payer: BC Managed Care – PPO | Source: Ambulatory Visit | Attending: Nurse Practitioner | Admitting: Nurse Practitioner

## 2022-02-16 ENCOUNTER — Ambulatory Visit (INDEPENDENT_AMBULATORY_CARE_PROVIDER_SITE_OTHER): Payer: BC Managed Care – PPO | Admitting: Nurse Practitioner

## 2022-02-16 VITALS — BP 106/68 | HR 74 | Resp 14

## 2022-02-16 DIAGNOSIS — Z87410 Personal history of cervical dysplasia: Secondary | ICD-10-CM | POA: Insufficient documentation

## 2022-02-16 DIAGNOSIS — Z124 Encounter for screening for malignant neoplasm of cervix: Secondary | ICD-10-CM

## 2022-02-16 NOTE — Progress Notes (Signed)
   Acute Office Visit  Subjective:    Patient ID: Kaylee Hunt, female    DOB: 1985/08/18, 36 y.o.   MRN: [id number]   HPI 36 y.o. presents today for 1 year pap repeat. 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 Last pap 02/02/2021 normal cytology neg HPV.    Review of Systems  Constitutional: Negative.   Genitourinary: Negative.        Objective:    Physical Exam Constitutional:      Appearance: Normal appearance.  Genitourinary:    General: Normal vulva.     Vagina: Normal.     Cervix: Normal.     BP 106/68   Pulse 74   Resp 14   LMP 02/11/2022 (Exact Date)  Wt Readings from Last 3 Encounters:  01/11/22 180 lb (81.6 kg)  10/20/21 173 lb 15.1 oz (78.9 kg)  06/16/21 174 lb (78.9 kg)        Patient informed chaperone available to be present for breast and/or pelvic exam. Patient has requested no chaperone to be present. Patient has been advised what will be completed during breast and pelvic exam.   Assessment & Plan:   Problem List Items Addressed This Visit   None Visit Diagnoses     History of cervical dysplasia    -  Primary   Relevant Orders   Cytology - PAP( [facility])      Plan: Pap pending.      Tamela Gammon DNP, 2:09 PM 02/16/2022

## 2022-02-22 DIAGNOSIS — M7918 Myalgia, other site: Secondary | ICD-10-CM | POA: Diagnosis not present

## 2022-02-22 DIAGNOSIS — M47816 Spondylosis without myelopathy or radiculopathy, lumbar region: Secondary | ICD-10-CM | POA: Diagnosis not present

## 2022-02-22 DIAGNOSIS — G894 Chronic pain syndrome: Secondary | ICD-10-CM | POA: Diagnosis not present

## 2022-02-22 DIAGNOSIS — G8929 Other chronic pain: Secondary | ICD-10-CM | POA: Diagnosis not present

## 2022-02-22 DIAGNOSIS — M533 Sacrococcygeal disorders, not elsewhere classified: Secondary | ICD-10-CM | POA: Diagnosis not present

## 2022-02-23 LAB — CYTOLOGY - PAP
Comment: NEGATIVE
Diagnosis: NEGATIVE
Diagnosis: REACTIVE
High risk HPV: NEGATIVE

## 2022-02-27 DIAGNOSIS — I10 Essential (primary) hypertension: Secondary | ICD-10-CM | POA: Diagnosis not present

## 2022-03-04 DIAGNOSIS — F4312 Post-traumatic stress disorder, chronic: Secondary | ICD-10-CM | POA: Diagnosis not present

## 2022-03-04 DIAGNOSIS — F4323 Adjustment disorder with mixed anxiety and depressed mood: Secondary | ICD-10-CM | POA: Diagnosis not present

## 2022-03-30 DIAGNOSIS — I10 Essential (primary) hypertension: Secondary | ICD-10-CM | POA: Diagnosis not present

## 2022-04-05 DIAGNOSIS — G894 Chronic pain syndrome: Secondary | ICD-10-CM | POA: Diagnosis not present

## 2022-04-30 DIAGNOSIS — I10 Essential (primary) hypertension: Secondary | ICD-10-CM | POA: Diagnosis not present

## 2022-05-03 DIAGNOSIS — M47816 Spondylosis without myelopathy or radiculopathy, lumbar region: Secondary | ICD-10-CM | POA: Diagnosis not present

## 2022-05-03 DIAGNOSIS — G8929 Other chronic pain: Secondary | ICD-10-CM | POA: Diagnosis not present

## 2022-05-03 DIAGNOSIS — M7918 Myalgia, other site: Secondary | ICD-10-CM | POA: Diagnosis not present

## 2022-05-17 ENCOUNTER — Ambulatory Visit (LOCAL_COMMUNITY_HEALTH_CENTER): Payer: BC Managed Care – PPO

## 2022-05-17 VITALS — BP 149/77 | Ht 64.0 in | Wt 185.5 lb

## 2022-05-17 DIAGNOSIS — Z308 Encounter for other contraceptive management: Secondary | ICD-10-CM | POA: Diagnosis not present

## 2022-05-17 DIAGNOSIS — Z3201 Encounter for pregnancy test, result positive: Secondary | ICD-10-CM

## 2022-05-17 LAB — PREGNANCY, URINE: Preg Test, Ur: POSITIVE — AB

## 2022-05-17 MED ORDER — PRENATAL VITAMINS 28-0.8 MG PO TABS: 28.0000 mg | ORAL_TABLET | Freq: Every day | ORAL | 0 refills | Status: AC

## 2022-05-17 NOTE — Progress Notes (Addendum)
UPT positive.  Desires to receive prenatal care at [facility] stating she received prenatal care here in the past.  States she had some complications with first pregnancy but none with second pregnancy. Positive pregnancy packet given and reviewed with pt.  See pregnancy flowsheet.  The patient was dispensed prenatal vitamins today. I provided counseling today regarding the medication. Patient given the opportunity to ask questions. Questions answered.  Walked pt to clerk for presumptive eligibility.   Tonny Branch, RN

## 2022-05-22 ENCOUNTER — Encounter: Payer: Self-pay | Admitting: Internal Medicine

## 2022-05-22 ENCOUNTER — Ambulatory Visit (INDEPENDENT_AMBULATORY_CARE_PROVIDER_SITE_OTHER): Payer: BC Managed Care – PPO | Admitting: Internal Medicine

## 2022-05-22 VITALS — BP 124/82 | HR 87 | Temp 98.0°F | Resp 16 | Ht 64.0 in | Wt 185.0 lb

## 2022-05-22 DIAGNOSIS — Z0000 Encounter for general adult medical examination without abnormal findings: Secondary | ICD-10-CM | POA: Diagnosis not present

## 2022-05-22 NOTE — Progress Notes (Unsigned)
Subjective:  Patient ID: Kaylee Hunt, female    DOB: 09-13-1985  Age: 37 y.o. MRN: [id number]  CC: Annual Exam   HPI Kaylee Hunt presents for a CPX ----  She is pregnant and feels well.  Outpatient Medications Prior to Visit  Medication Sig Dispense Refill   cyclobenzaprine (FLEXERIL) 5 MG tablet Take 5 mg by mouth at bedtime as needed for muscle spasms.     gabapentin (NEURONTIN) 100 MG capsule Take by mouth.     Prenatal Vit-Fe Fumarate-FA (PRENATAL VITAMINS) 28-0.8 MG TABS Take 28 mg by mouth daily. 100 tablet 0   No facility-administered medications prior to visit.    ROS Review of Systems  Constitutional: Negative.   HENT: Negative.    Eyes: Negative.   Respiratory: Negative.    Cardiovascular: Negative.   Gastrointestinal: Negative.   Endocrine: Negative.   Genitourinary: Negative.   Musculoskeletal: Negative.   Skin: Negative.   Neurological: Negative.   Hematological: Negative.   Psychiatric/Behavioral: Negative.      Objective:  BP 124/82 (BP Location: Left Arm, Patient Position: Sitting, Cuff Size: Large)   Pulse 87   Temp 98 F (36.7 C) (Oral)   Resp 16   Ht 5\' 4"  (1.626 m)   Wt 185 lb (83.9 kg)   LMP 04/08/2022 (Exact Date)   SpO2 99%   BMI 31.76 kg/m   BP Readings from Last 3 Encounters:  05/22/22 124/82  05/17/22 (!) 149/77  02/16/22 106/68    Wt Readings from Last 3 Encounters:  05/22/22 185 lb (83.9 kg)  05/17/22 185 lb 8 oz (84.1 kg)  01/11/22 180 lb (81.6 kg)    Physical Exam Vitals reviewed.  HENT:     Nose: Nose normal.     Mouth/Throat:     Mouth: Mucous membranes are moist.  Eyes:     General: No scleral icterus.    Conjunctiva/sclera: Conjunctivae normal.  Cardiovascular:     Rate and Rhythm: Normal rate and regular rhythm.     Pulses: Normal pulses.     Heart sounds: No murmur heard. Pulmonary:     Effort: Pulmonary effort is normal.     Breath sounds: No stridor. No wheezing, rhonchi or rales.  Abdominal:      General: Abdomen is flat.     Palpations: There is no mass.     Tenderness: There is no abdominal tenderness. There is no guarding.     Hernia: No hernia is present.  Musculoskeletal:        General: Normal range of motion.     Cervical back: Neck supple.     Right lower leg: No edema.     Left lower leg: No edema.  Lymphadenopathy:     Cervical: No cervical adenopathy.  Skin:    General: Skin is warm and dry.     Coloration: Skin is not pale.  Neurological:     General: No focal deficit present.     Mental Status: Mental status is at baseline.  Psychiatric:        Mood and Affect: Mood normal.        Behavior: Behavior normal.     Lab Results  Component Value Date   WBC 9.8 05/19/2021   HGB 12.7 05/19/2021   HCT 38.4 05/19/2021   PLT 220.0 05/19/2021   GLUCOSE 90 05/19/2021   CHOL 118 05/13/2020   TRIG 40.0 05/13/2020   HDL 60.80 05/13/2020   LDLCALC 49 05/13/2020   ALT 17 05/19/2021  AST 19 05/19/2021   NA 139 05/19/2021   K 4.3 05/19/2021   CL 106 05/19/2021   CREATININE 0.69 05/19/2021   BUN 12 05/19/2021   CO2 28 05/19/2021   TSH 1.15 05/19/2021    No results found.  Assessment & Plan:  Routine general medical examination at a health care facility     Follow-up: No follow-ups on file.  [name], MD

## 2022-05-24 ENCOUNTER — Ambulatory Visit: Payer: BC Managed Care – PPO | Admitting: Nurse Practitioner

## 2022-05-25 DIAGNOSIS — F4323 Adjustment disorder with mixed anxiety and depressed mood: Secondary | ICD-10-CM | POA: Diagnosis not present

## 2022-05-25 DIAGNOSIS — F331 Major depressive disorder, recurrent, moderate: Secondary | ICD-10-CM | POA: Diagnosis not present

## 2022-05-29 ENCOUNTER — Ambulatory Visit
Admission: EM | Admit: 2022-05-29 | Discharge: 2022-05-29 | Disposition: A | Discharge status: Home / Self Care | Payer: BC Managed Care – PPO

## 2022-05-30 DIAGNOSIS — I10 Essential (primary) hypertension: Secondary | ICD-10-CM | POA: Diagnosis not present

## 2022-06-05 ENCOUNTER — Ambulatory Visit (INDEPENDENT_AMBULATORY_CARE_PROVIDER_SITE_OTHER): Payer: BC Managed Care – PPO

## 2022-06-05 VITALS — Wt 184.0 lb

## 2022-06-05 DIAGNOSIS — Z369 Encounter for antenatal screening, unspecified: Secondary | ICD-10-CM

## 2022-06-05 DIAGNOSIS — Z3689 Encounter for other specified antenatal screening: Secondary | ICD-10-CM

## 2022-06-05 DIAGNOSIS — Z3480 Encounter for supervision of other normal pregnancy, unspecified trimester: Secondary | ICD-10-CM | POA: Insufficient documentation

## 2022-06-05 DIAGNOSIS — Z131 Encounter for screening for diabetes mellitus: Secondary | ICD-10-CM

## 2022-06-05 NOTE — Patient Instructions (Signed)
First Trimester of Pregnancy  The first trimester of pregnancy starts on the first day of your last menstrual period until the end of week 12. This is also called months 1 through 3 of pregnancy. Body changes during your first trimester Your body goes through many changes during pregnancy. The changes usually return to normal after your baby is born. Physical changes You may gain or lose weight. Your breasts may grow larger and hurt. The area around your nipples may get darker. Dark spots or blotches may develop on your face. You may have changes in your hair. Health changes You may feel like you might vomit (nauseous), and you may vomit. You may have heartburn. You may have headaches. You may have trouble pooping (constipation). Your gums may bleed. Other changes You may get tired easily. You may pee (urinate) more often. Your menstrual periods will stop. You may not feel hungry. You may want to eat certain kinds of food. You may have changes in your emotions from day to day. You may have more dreams. Follow these instructions at home: Medicines Take over-the-counter and prescription medicines only as told by your doctor. Some medicines are not safe during pregnancy. Take a prenatal vitamin that contains at least 600 micrograms (mcg) of folic acid. Eating and drinking Eat healthy meals that include: Fresh fruits and vegetables. Whole grains. Good sources of protein, such as meat, eggs, or tofu. Low-fat dairy products. Avoid raw meat and unpasteurized juice, milk, and cheese. If you feel like you may vomit, or you vomit: Eat 4 or 5 small meals a day instead of 3 large meals. Try eating a few soda crackers. Drink liquids between meals instead of during meals. You may need to take these actions to prevent or treat trouble pooping: Drink enough fluids to keep your pee (urine) pale yellow. Eat foods that are high in fiber. These include beans, whole grains, and fresh fruits and  vegetables. Limit foods that are high in fat and sugar. These include fried or sweet foods. Activity Exercise only as told by your doctor. Most people can do their usual exercise routine during pregnancy. Stop exercising if you have cramps or pain in your lower belly (abdomen) or low back. Do not exercise if it is too hot or too humid, or if you are in a place of great height (high altitude). Avoid heavy lifting. If you choose to, you may have sex unless your doctor tells you not to. Relieving pain and discomfort Wear a good support bra if your breasts are sore. Rest with your legs raised (elevated) if you have leg cramps or low back pain. If you have bulging veins (varicose veins) in your legs: Wear support hose as told by your doctor. Raise your feet for 15 minutes, 3-4 times a day. Limit salt in your food. Safety Wear your seat belt at all times when you are in a car. Talk with your doctor if someone is hurting you or yelling at you. Talk with your doctor if you are feeling sad or have thoughts of hurting yourself. Lifestyle Do not use hot tubs, steam rooms, or saunas. Do not douche. Do not use tampons or scented sanitary pads. Do not use herbal medicines, illegal drugs, or medicines that are not approved by your doctor. Do not drink alcohol. Do not smoke or use any products that contain nicotine or tobacco. If you need help quitting, ask your doctor. Avoid cat litter boxes and soil that is used by cats. These carry   germs that can cause harm to the baby and can cause a loss of your baby by miscarriage or stillbirth. General instructions Keep all follow-up visits. This is important. Ask for help if you need counseling or if you need help with nutrition. Your doctor can give you advice or tell you where to go for help. Visit your dentist. At home, brush your teeth with a soft toothbrush. Floss gently. Write down your questions. Take them to your prenatal visits. Where to find more  information American Pregnancy Association: americanpregnancy.org American College of Obstetricians and Gynecologists: www.acog.org Office on Women's Health: womenshealth.gov/pregnancy Contact a doctor if: You are dizzy. You have a fever. You have mild cramps or pressure in your lower belly. You have a nagging pain in your belly area. You continue to feel like you may vomit, you vomit, or you have watery poop (diarrhea) for 24 hours or longer. You have a bad-smelling fluid coming from your vagina. You have pain when you pee. You are exposed to a disease that spreads from person to person, such as chickenpox, measles, Zika virus, HIV, or hepatitis. Get help right away if: You have spotting or bleeding from your vagina. You have very bad belly cramping or pain. You have shortness of breath or chest pain. You have any kind of injury, such as from a fall or a car crash. You have new or increased pain, swelling, or redness in an arm or leg. Summary The first trimester of pregnancy starts on the first day of your last menstrual period until the end of week 12 (months 1 through 3). Eat 4 or 5 small meals a day instead of 3 large meals. Do not smoke or use any products that contain nicotine or tobacco. If you need help quitting, ask your doctor. Keep all follow-up visits. This information is not intended to replace advice given to you by your health care provider. Make sure you discuss any questions you have with your health care provider. Document Revised: 07/23/2019 Document Reviewed: 05/29/2019 Elsevier Patient Education  2023 Elsevier Inc. Commonly Asked Questions During Pregnancy  Cats: A parasite can be excreted in cat feces.  To avoid exposure you need to have another person empty the little box.  If you must empty the litter box you will need to wear gloves.  Wash your hands after handling your cat.  This parasite can also be found in raw or undercooked meat so this should also be  avoided.  Colds, Sore Throats, Flu: Please check your medication sheet to see what you can take for symptoms.  If your symptoms are unrelieved by these medications please call the office.  Dental Work: Most any dental work your dentist recommends is permitted.  X-rays should only be taken during the first trimester if absolutely necessary.  Your abdomen should be shielded with a lead apron during all x-rays.  Please notify your provider prior to receiving any x-rays.  Novocaine is fine; gas is not recommended.  If your dentist requires a note from us prior to dental work please call the office and we will provide one for you.  Exercise: Exercise is an important part of staying healthy during your pregnancy.  You may continue most exercises you were accustomed to prior to pregnancy.  Later in your pregnancy you will most likely notice you have difficulty with activities requiring balance like riding a bicycle.  It is important that you listen to your body and avoid activities that put you at a higher   risk of falling.  Adequate rest and staying well hydrated are a must!  If you have questions about the safety of specific activities ask your provider.    Exposure to Children with illness: Try to avoid obvious exposure; report any symptoms to us when noted,  If you have chicken pos, red measles or mumps, you should be immune to these diseases.   Please do not take any vaccines while pregnant unless you have checked with your OB provider.  Fetal Movement: After 28 weeks we recommend you do "kick counts" twice daily.  Lie or sit down in a calm quiet environment and count your baby movements "kicks".  You should feel your baby at least 10 times per hour.  If you have not felt 10 kicks within the first hour get up, walk around and have something sweet to eat or drink then repeat for an additional hour.  If count remains less than 10 per hour notify your provider.  Fumigating: Follow your pest control agent's  advice as to how long to stay out of your home.  Ventilate the area well before re-entering.  Hemorrhoids:   Most over-the-counter preparations can be used during pregnancy.  Check your medication to see what is safe to use.  It is important to use a stool softener or fiber in your diet and to drink lots of liquids.  If hemorrhoids seem to be getting worse please call the office.   Hot Tubs:  Hot tubs Jacuzzis and saunas are not recommended while pregnant.  These increase your internal body temperature and should be avoided.  Intercourse:  Sexual intercourse is safe during pregnancy as long as you are comfortable, unless otherwise advised by your provider.  Spotting may occur after intercourse; report any bright red bleeding that is heavier than spotting.  Labor:  If you know that you are in labor, please go to the hospital.  If you are unsure, please call the office and let us help you decide what to do.  Lifting, straining, etc:  If your job requires heavy lifting or straining please check with your provider for any limitations.  Generally, you should not lift items heavier than that you can lift simply with your hands and arms (no back muscles)  Painting:  Paint fumes do not harm your pregnancy, but may make you ill and should be avoided if possible.  Latex or water based paints have less odor than oils.  Use adequate ventilation while painting.  Permanents & Hair Color:  Chemicals in hair dyes are not recommended as they cause increase hair dryness which can increase hair loss during pregnancy.  " Highlighting" and permanents are allowed.  Dye may be absorbed differently and permanents may not hold as well during pregnancy.  Sunbathing:  Use a sunscreen, as skin burns easily during pregnancy.  Drink plenty of fluids; avoid over heating.  Tanning Beds:  Because their possible side effects are still unknown, tanning beds are not recommended.  Ultrasound Scans:  Routine ultrasounds are performed  at approximately 20 weeks.  You will be able to see your baby's general anatomy an if you would like to know the gender this can usually be determined as well.  If it is questionable when you conceived you may also receive an ultrasound early in your pregnancy for dating purposes.  Otherwise ultrasound exams are not routinely performed unless there is a medical necessity.  Although you can request a scan we ask that you pay for it when   conducted because insurance does not cover " patient request" scans.  Work: If your pregnancy proceeds without complications you may work until your due date, unless your physician or employer advises otherwise.  Round Ligament Pain/Pelvic Discomfort:  Sharp, shooting pains not associated with bleeding are fairly common, usually occurring in the second trimester of pregnancy.  They tend to be worse when standing up or when you remain standing for long periods of time.  These are the result of pressure of certain pelvic ligaments called "round ligaments".  Rest, Tylenol and heat seem to be the most effective relief.  As the womb and fetus grow, they rise out of the pelvis and the discomfort improves.  Please notify the office if your pain seems different than that described.  It may represent a more serious condition.  Common Medications Safe in Pregnancy  Acne:      Constipation:  Benzoyl Peroxide     Colace  Clindamycin      Dulcolax Suppository  Topica Erythromycin     Fibercon  Salicylic Acid      Metamucil         Miralax AVOID:        Senakot   Accutane    Cough:  Retin-A       Cough Drops  Tetracycline      Phenergan w/ Codeine if Rx  Minocycline      Robitussin (Plain & DM)  Antibiotics:     Crabs/Lice:  Ceclor       RID  Cephalosporins    AVOID:  E-Mycins      Kwell  Keflex  Macrobid/Macrodantin   Diarrhea:  Penicillin      Kao-Pectate  Zithromax      Imodium AD         PUSH FLUIDS AVOID:       Cipro     Fever:  Tetracycline      Tylenol (Regular  or Extra  Minocycline       Strength)  Levaquin      Extra Strength-Do not          Exceed 8 tabs/24 hrs Caffeine:        <200mg/day (equiv. To 1 cup of coffee or  approx. 3 12 oz sodas)         Gas: Cold/Hayfever:       Gas-X  Benadryl      Mylicon  Claritin       Phazyme  **Claritin-D        Chlor-Trimeton    Headaches:  Dimetapp      ASA-Free Excedrin  Drixoral-Non-Drowsy     Cold Compress  Mucinex (Guaifenasin)     Tylenol (Regular or Extra  Sudafed/Sudafed-12 Hour     Strength)  **Sudafed PE Pseudoephedrine   Tylenol Cold & Sinus     Vicks Vapor Rub  Zyrtec  **AVOID if Problems With Blood Pressure         Heartburn: Avoid lying down for at least 1 hour after meals  Aciphex      Maalox     Rash:  Milk of Magnesia     Benadryl    Mylanta       1% Hydrocortisone Cream  Pepcid  Pepcid Complete   Sleep Aids:  Prevacid      Ambien   Prilosec       Benadryl  Rolaids       Chamomile Tea  Tums (Limit 4/day)     Unisom           Tylenol PM         Warm milk-add vanilla or  Hemorrhoids:       Sugar for taste  Anusol/Anusol H.C.  (RX: Analapram 2.5%)  Sugar Substitutes:  Hydrocortisone OTC     Ok in moderation  Preparation H      Tucks        Vaseline lotion applied to tissue with wiping    Herpes:     Throat:  Acyclovir      Oragel  Famvir  Valtrex     Vaccines:         Flu Shot Leg Cramps:       *Gardasil  Benadryl      Hepatitis A         Hepatitis B Nasal Spray:       Pneumovax  Saline Nasal Spray     Polio Booster         Tetanus Nausea:       Tuberculosis test or PPD  Vitamin B6 25 mg TID   AVOID:    Dramamine      *Gardasil  Emetrol       Live Poliovirus  Ginger Root 250 mg QID    MMR (measles, mumps &  High Complex Carbs @ Bedtime    rebella)  Sea Bands-Accupressure    Varicella (Chickenpox)  Unisom 1/2 tab TID     *No known complications           If received before Pain:         Known pregnancy;   Darvocet       Resume series  after  Lortab        Delivery  Percocet    Yeast:   Tramadol      Femstat  Tylenol 3      Gyne-lotrimin  Ultram       Monistat  Vicodin           MISC:         All Sunscreens           Hair Coloring/highlights          Insect Repellant's          (Including DEET)         Mystic Tans  
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
## 2022-06-13 DIAGNOSIS — F4323 Adjustment disorder with mixed anxiety and depressed mood: Secondary | ICD-10-CM | POA: Diagnosis not present

## 2022-06-23 ENCOUNTER — Telehealth: Payer: Self-pay

## 2022-06-23 NOTE — Telephone Encounter (Signed)
Patient contacted office today with concerns of vaginal spotting and light vaginal bleeding for the past 3 days. Patient report that she began spotting and has notice at times when wiping after using restroom she sees streaks of light red blood. Patient denied symptoms of back pain, nausea, vomiting or fever but reported some slight abdominal cramping. Patient states that she has first OB appt scheduled for Monday 06/26/22. Per protocol I advised patient that light bleeding/spotting can be normal in the first trimester and we advise that she continue to monitor symptoms. Patient advised that if she experiences heavy bleeding or is soaking a pad every 30min-1hr she should be seen by a doctor same day or immediately go to ED.

## 2022-06-26 ENCOUNTER — Ambulatory Visit (INDEPENDENT_AMBULATORY_CARE_PROVIDER_SITE_OTHER): Payer: BC Managed Care – PPO

## 2022-06-26 ENCOUNTER — Ambulatory Visit (INDEPENDENT_AMBULATORY_CARE_PROVIDER_SITE_OTHER): Payer: BC Managed Care – PPO | Admitting: Obstetrics & Gynecology

## 2022-06-26 ENCOUNTER — Other Ambulatory Visit: Payer: BC Managed Care – PPO

## 2022-06-26 DIAGNOSIS — O3680X0 Pregnancy with inconclusive fetal viability, not applicable or unspecified: Secondary | ICD-10-CM

## 2022-06-26 DIAGNOSIS — Z3687 Encounter for antenatal screening for uncertain dates: Secondary | ICD-10-CM

## 2022-06-26 DIAGNOSIS — Z3A01 Less than 8 weeks gestation of pregnancy: Secondary | ICD-10-CM

## 2022-06-26 DIAGNOSIS — Z3A11 11 weeks gestation of pregnancy: Secondary | ICD-10-CM | POA: Diagnosis not present

## 2022-06-26 DIAGNOSIS — Z3480 Encounter for supervision of other normal pregnancy, unspecified trimester: Secondary | ICD-10-CM

## 2022-06-26 DIAGNOSIS — Z369 Encounter for antenatal screening, unspecified: Secondary | ICD-10-CM

## 2022-06-26 NOTE — Progress Notes (Signed)
   Established Patient Office Visit  Subjective   Patient ID: Kaylee Hunt, female    DOB: 1985/11/19  Age: 37 y.o. MRN: [id number]  No chief complaint on file.   HPI   37 yo G5P2A2 at what should be 11.[redacted] weeks EGA. She has had spotting for about 2 days, no heavy bleeding. Her initial + UPT was in March. She had an ultrasound today for dating and no fetal pole was seen along with an abnormal sac.  Objective:     LMP 04/08/2022 (Exact Date)    Physical Exam   Well nourished, well hydrated White female, no apparent distress She is ambulating and conversing normally. I spoke with her about the strong likelihood that this is a non-viable pregnancy.  With her past losses, she has not needed a d&c or cytotec.  Blood type A+  Assessment & Plan:  Probable abnormal/non-viable pregnancy I have ordered serial QBHCG and ultrasoudn for next week Bleeding precautions reviewed  Problem List Items Addressed This Visit   None Visit Diagnoses     Pregnancy with uncertain fetal viability, single or unspecified fetus    -  Primary   Relevant Orders   B-HCG Quant   US OB Comp Less 14 Wks       No follow-ups on file.    Allie Bossier, MD

## 2022-06-27 ENCOUNTER — Encounter: Payer: Self-pay | Admitting: Obstetrics & Gynecology

## 2022-06-27 ENCOUNTER — Other Ambulatory Visit: Payer: Self-pay | Admitting: Obstetrics & Gynecology

## 2022-06-27 DIAGNOSIS — O3680X0 Pregnancy with inconclusive fetal viability, not applicable or unspecified: Secondary | ICD-10-CM

## 2022-06-27 LAB — BETA HCG QUANT (REF LAB): hCG Quant: 2293 m[IU]/mL

## 2022-06-28 ENCOUNTER — Other Ambulatory Visit: Payer: BC Managed Care – PPO

## 2022-06-28 DIAGNOSIS — O3680X0 Pregnancy with inconclusive fetal viability, not applicable or unspecified: Secondary | ICD-10-CM | POA: Diagnosis not present

## 2022-06-29 DIAGNOSIS — I10 Essential (primary) hypertension: Secondary | ICD-10-CM | POA: Diagnosis not present

## 2022-06-29 LAB — BETA HCG QUANT (REF LAB): hCG Quant: 1710 m[IU]/mL

## 2022-06-30 ENCOUNTER — Other Ambulatory Visit: Payer: BC Managed Care – PPO

## 2022-06-30 DIAGNOSIS — O3680X0 Pregnancy with inconclusive fetal viability, not applicable or unspecified: Secondary | ICD-10-CM

## 2022-07-01 LAB — BETA HCG QUANT (REF LAB): hCG Quant: 1122 m[IU]/mL

## 2022-07-04 ENCOUNTER — Encounter: Payer: Self-pay | Admitting: Obstetrics and Gynecology

## 2022-07-04 ENCOUNTER — Ambulatory Visit (INDEPENDENT_AMBULATORY_CARE_PROVIDER_SITE_OTHER): Payer: BC Managed Care – PPO | Admitting: Obstetrics and Gynecology

## 2022-07-04 VITALS — BP 117/77 | HR 76 | Ht 64.0 in | Wt 183.8 lb

## 2022-07-04 DIAGNOSIS — O3680X0 Pregnancy with inconclusive fetal viability, not applicable or unspecified: Secondary | ICD-10-CM | POA: Diagnosis not present

## 2022-07-04 DIAGNOSIS — Z3A01 Less than 8 weeks gestation of pregnancy: Secondary | ICD-10-CM | POA: Diagnosis not present

## 2022-07-04 DIAGNOSIS — O209 Hemorrhage in early pregnancy, unspecified: Secondary | ICD-10-CM | POA: Diagnosis not present
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
## 2022-07-04 NOTE — Progress Notes (Signed)
Patient presents today to follow-up on recent lab work and vaginal bleeding in early pregnancy. She states light bleeding or spotting over the past 2 weeks and moderate cramping the past 3 days.

## 2022-07-05 ENCOUNTER — Ambulatory Visit
Admission: RE | Admit: 2022-07-05 | Discharge: 2022-07-05 | Disposition: A | Discharge status: Home / Self Care | Payer: BC Managed Care – PPO | Source: Ambulatory Visit | Attending: Obstetrics and Gynecology | Admitting: Obstetrics and Gynecology

## 2022-07-05 DIAGNOSIS — O209 Hemorrhage in early pregnancy, unspecified: Secondary | ICD-10-CM | POA: Diagnosis not present

## 2022-07-05 DIAGNOSIS — O3680X0 Pregnancy with inconclusive fetal viability, not applicable or unspecified: Secondary | ICD-10-CM | POA: Insufficient documentation

## 2022-07-05 DIAGNOSIS — Z3689 Encounter for other specified antenatal screening: Secondary | ICD-10-CM | POA: Diagnosis not present

## 2022-07-05 DIAGNOSIS — Z3A01 Less than 8 weeks gestation of pregnancy: Secondary | ICD-10-CM | POA: Diagnosis not present

## 2022-07-05 DIAGNOSIS — L732 Hidradenitis suppurativa: Secondary | ICD-10-CM | POA: Diagnosis not present

## 2022-07-05 LAB — BETA HCG QUANT (REF LAB): hCG Quant: 685 m[IU]/mL

## 2022-07-06 ENCOUNTER — Encounter: Payer: Self-pay | Admitting: Internal Medicine

## 2022-07-06 DIAGNOSIS — R92343 Mammographic extreme density, bilateral breasts: Secondary | ICD-10-CM | POA: Diagnosis not present

## 2022-07-06 DIAGNOSIS — R928 Other abnormal and inconclusive findings on diagnostic imaging of breast: Secondary | ICD-10-CM | POA: Diagnosis not present

## 2022-07-06 DIAGNOSIS — N6311 Unspecified lump in the right breast, upper outer quadrant: Secondary | ICD-10-CM | POA: Diagnosis not present

## 2022-07-12 ENCOUNTER — Telehealth: Payer: Self-pay

## 2022-07-12 ENCOUNTER — Telehealth: Payer: Self-pay | Admitting: Internal Medicine

## 2022-07-12 NOTE — Telephone Encounter (Signed)
Spoke with patient, scheduled for appt 5/16. Wants to take Cytotec.

## 2022-07-12 NOTE — Telephone Encounter (Signed)
Patient contacted office requesting results of recent ultrasound on 07/05/22, patient was last seen by Dr. Logan Bores on 07/04/22. Please review results and advise. KW

## 2022-07-12 NOTE — Telephone Encounter (Signed)
We have received FMLA in the fax, to be filled out by provider. Patient requested to send it via Fax within 7-days. Document is located in providers tray at front office.Please advise at Mobile [phone] (mobile)   Please fax to: [phone]

## 2022-07-13 ENCOUNTER — Encounter: Payer: Self-pay | Admitting: Obstetrics and Gynecology

## 2022-07-13 ENCOUNTER — Encounter: Payer: Self-pay | Admitting: Internal Medicine

## 2022-07-13 ENCOUNTER — Ambulatory Visit (INDEPENDENT_AMBULATORY_CARE_PROVIDER_SITE_OTHER): Payer: BC Managed Care – PPO | Admitting: Internal Medicine

## 2022-07-13 ENCOUNTER — Ambulatory Visit (INDEPENDENT_AMBULATORY_CARE_PROVIDER_SITE_OTHER): Payer: BC Managed Care – PPO | Admitting: Obstetrics and Gynecology

## 2022-07-13 VITALS — BP 117/89 | HR 76 | Ht 64.0 in | Wt 184.0 lb

## 2022-07-13 VITALS — BP 128/80 | HR 110 | Temp 98.2°F | Resp 16 | Ht 64.0 in | Wt 184.0 lb

## 2022-07-13 DIAGNOSIS — F331 Major depressive disorder, recurrent, moderate: Secondary | ICD-10-CM | POA: Diagnosis not present

## 2022-07-13 DIAGNOSIS — O021 Missed abortion: Secondary | ICD-10-CM

## 2022-07-13 DIAGNOSIS — R000 Tachycardia, unspecified: Secondary | ICD-10-CM | POA: Diagnosis not present

## 2022-07-13 DIAGNOSIS — O039 Complete or unspecified spontaneous abortion without complication: Secondary | ICD-10-CM | POA: Diagnosis not present

## 2022-07-13 LAB — CBC WITH DIFFERENTIAL/PLATELET
Basophils Absolute: 0 10*3/uL (ref 0.0–0.1)
Basophils Relative: 0.5 % (ref 0.0–3.0)
Eosinophils Absolute: 0 10*3/uL (ref 0.0–0.7)
Eosinophils Relative: 0.2 % (ref 0.0–5.0)
HCT: 39.4 % (ref 36.0–46.0)
Hemoglobin: 13.3 g/dL (ref 12.0–15.0)
Lymphocytes Relative: 16 % (ref 12.0–46.0)
Lymphs Abs: 1.5 10*3/uL (ref 0.7–4.0)
MCHC: 33.7 g/dL (ref 30.0–36.0)
MCV: 84.7 fl (ref 78.0–100.0)
Monocytes Absolute: 0.4 10*3/uL (ref 0.1–1.0)
Monocytes Relative: 4.5 % (ref 3.0–12.0)
Neutro Abs: 7.4 10*3/uL (ref 1.4–7.7)
Neutrophils Relative %: 78.8 % — ABNORMAL HIGH (ref 43.0–77.0)
Platelets: 237 10*3/uL (ref 150.0–400.0)
RBC: 4.65 Mil/uL (ref 3.87–5.11)
RDW: 13.6 % (ref 11.5–15.5)
WBC: 9.3 10*3/uL (ref 4.0–10.5)

## 2022-07-13 LAB — BASIC METABOLIC PANEL
BUN: 5 mg/dL — ABNORMAL LOW (ref 6–23)
CO2: 30 mEq/L (ref 19–32)
Calcium: 9.6 mg/dL (ref 8.4–10.5)
Chloride: 102 mEq/L (ref 96–112)
Creatinine, Ser: 0.71 mg/dL (ref 0.40–1.20)
GFR: 108.91 mL/min (ref >60.00)
Glucose, Bld: 102 mg/dL — ABNORMAL HIGH (ref 70–99)
Potassium: 3.7 mEq/L (ref 3.5–5.1)
Sodium: 139 mEq/L (ref 135–145)

## 2022-07-13 LAB — TSH: TSH: 0.6 u[IU]/mL (ref 0.35–5.50)

## 2022-07-13 LAB — HEPATIC FUNCTION PANEL
ALT: 11 U/L (ref 0–35)
AST: 15 U/L (ref 0–37)
Albumin: 4.4 g/dL (ref 3.5–5.2)
Alkaline Phosphatase: 92 U/L (ref 39–117)
Bilirubin, Direct: 0.2 mg/dL (ref 0.0–0.3)
Total Bilirubin: 0.8 mg/dL (ref 0.2–1.2)
Total Protein: 7.6 g/dL (ref 6.0–8.3)

## 2022-07-13 MED ORDER — HYDROCODONE-ACETAMINOPHEN 5-325 MG PO TABS
2.0000 | ORAL_TABLET | ORAL | 0 refills | Status: DC | PRN
Start: 2022-07-13 — End: 2022-08-14

## 2022-07-13 MED ORDER — MISOPROSTOL 200 MCG PO TABS
ORAL_TABLET | ORAL | 0 refills | Status: DC
Start: 2022-07-13 — End: 2022-07-18

## 2022-07-13 NOTE — Patient Instructions (Signed)
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
## 2022-07-13 NOTE — Progress Notes (Signed)
Patient presents today to discuss miscarriage and her options. She states she would like to try Cytotec. Reports no bleeding since last week but still experiencing cramping.

## 2022-07-13 NOTE — Progress Notes (Signed)
Subjective:  Patient ID: Kaylee Hunt, female    DOB: Jan 02, 1986  Age: 37 y.o. MRN: [id number]  CC: Annual Exam and Diabetes   HPI Kaylee Hunt presents for f/up ---  She had a miscarriage 2 weeks ago and has been out of work. She is back at work ad needs to have a RTW form completed.  She complains of grief, apathy, and anhedonia.  She denies SI or HI.  She tells me she is ready to get back to work.  She has chronic tachycardia that she thinks is related to anxiety.  Outpatient Medications Prior to Visit  Medication Sig Dispense Refill   clindamycin (CLEOCIN T) 1 % lotion Apply topically 2 (two) times daily.     cyclobenzaprine (FLEXERIL) 5 MG tablet Take 5 mg by mouth at bedtime as needed for muscle spasms.     gabapentin (NEURONTIN) 300 MG capsule Take 300 mg by mouth at bedtime.     HYDROcodone-acetaminophen (NORCO/VICODIN) 5-325 MG tablet Take 2 tablets by mouth every 4 (four) hours as needed for moderate pain. 10 tablet 0   misoprostol (CYTOTEC) 200 MCG tablet Place four tablets in between your gums and cheeks (two tablets on each side) as instructed OR insert four tablets vaginally.  Repeat in 3 hours if cramping has not started. 8 tablet 0   Prenatal Vit-Fe Fumarate-FA (PRENATAL VITAMINS) 28-0.8 MG TABS Take 28 mg by mouth daily. (Patient not taking: Reported on 07/04/2022) 100 tablet 0   No facility-administered medications prior to visit.    ROS Review of Systems  Constitutional:  Positive for appetite change. Negative for activity change, diaphoresis, fatigue and unexpected weight change.  HENT: Negative.    Eyes: Negative.   Respiratory:  Negative for cough, chest tightness and wheezing.   Cardiovascular:  Negative for chest pain, palpitations and leg swelling.  Gastrointestinal:  Negative for abdominal pain, constipation, diarrhea and vomiting.  Endocrine: Negative.   Genitourinary: Negative.  Negative for difficulty urinating.  Musculoskeletal: Negative.   Skin:  Negative.   Neurological:  Negative for dizziness, weakness and headaches.  Hematological:  Negative for adenopathy. Does not bruise/bleed easily.  Psychiatric/Behavioral:  Positive for dysphoric mood and sleep disturbance. Negative for agitation, behavioral problems, confusion, decreased concentration, hallucinations, self-injury and suicidal ideas. The patient is nervous/anxious. The patient is not hyperactive.     Objective:  BP 128/80 (BP Location: Left Arm, Patient Position: Sitting, Cuff Size: Normal)   Pulse (!) 110   Temp 98.2 F (36.8 C) (Oral)   Resp 16   Ht 5\' 4"  (1.626 m)   Wt 184 lb (83.5 kg)   LMP 04/08/2022 (Exact Date)   SpO2 97%   BMI 31.58 kg/m   BP Readings from Last 3 Encounters:  07/13/22 128/80  07/13/22 117/89  07/04/22 117/77    Wt Readings from Last 3 Encounters:  07/13/22 184 lb (83.5 kg)  07/13/22 184 lb (83.5 kg)  07/04/22 183 lb 12.8 oz (83.4 kg)    Physical Exam Vitals reviewed.  Constitutional:      Appearance: Normal appearance. She is not ill-appearing.  HENT:     Nose: Nose normal.     Mouth/Throat:     Mouth: Mucous membranes are moist.  Eyes:     General: No scleral icterus.    Conjunctiva/sclera: Conjunctivae normal.  Cardiovascular:     Rate and Rhythm: Tachycardia present.     Heart sounds: No murmur heard.    No friction rub. No gallop.  Pulmonary:  Effort: Pulmonary effort is normal.     Breath sounds: No stridor. No wheezing, rhonchi or rales.  Abdominal:     General: Abdomen is flat.     Palpations: There is no mass.     Tenderness: There is no abdominal tenderness. There is no guarding.     Hernia: No hernia is present.  Musculoskeletal:        General: Normal range of motion.     Cervical back: Neck supple.     Right lower leg: No edema.     Left lower leg: No edema.  Lymphadenopathy:     Cervical: No cervical adenopathy.  Skin:    General: Skin is warm and dry.     Coloration: Skin is not pale.   Neurological:     General: No focal deficit present.     Mental Status: She is alert. Mental status is at baseline.  Psychiatric:        Attention and Perception: Attention and perception normal.        Mood and Affect: Mood is anxious. Mood is not depressed. Affect is not flat or tearful.        Speech: Speech normal. She is communicative. Speech is not rapid and pressured, delayed or tangential.        Behavior: Behavior normal. Behavior is cooperative.        Thought Content: Thought content normal. Thought content is not paranoid or delusional. Thought content does not include homicidal or suicidal ideation.        Cognition and Memory: Cognition normal.        Judgment: Judgment normal.     Lab Results  Component Value Date   WBC 9.3 07/13/2022   HGB 13.3 07/13/2022   HCT 39.4 07/13/2022   PLT 237.0 07/13/2022   GLUCOSE 102 (H) 07/13/2022   CHOL 118 05/13/2020   TRIG 40.0 05/13/2020   HDL 60.80 05/13/2020   LDLCALC 49 05/13/2020   ALT 11 07/13/2022   AST 15 07/13/2022   NA 139 07/13/2022   K 3.7 07/13/2022   CL 102 07/13/2022   CREATININE 0.71 07/13/2022   BUN 5 (L) 07/13/2022   CO2 30 07/13/2022   TSH 0.60 07/13/2022    US OB LESS THAN 14 WEEKS WITH OB TRANSVAGINAL  Result Date: 07/05/2022 CLINICAL DATA:  First trimester pregnancy, assessment of viability, bleeding for 2 weeks, LMP 04/08/2022 EXAM: OBSTETRIC <14 WK [place] AND TRANSVAGINAL OB US TECHNIQUE: Both transabdominal and transvaginal ultrasound examinations were performed for complete evaluation of the gestation as well as the maternal uterus, adnexal regions, and pelvic cul-de-sac. Transvaginal technique was performed to assess early pregnancy. COMPARISON:  06/26/2022 FINDINGS: Intrauterine gestational sac: Present, single, smaller than on previous exam and now located at lower uterine segment Yolk sac:  Absent Embryo:  Questionably visualized versus debris Cardiac Activity: N/A Heart Rate: N/A  bpm MSD: 15.0 mm   6  w   2 d CRL:    mm    w    d                  [place] EDC: Subchorionic hemorrhage:  Subchorionic hemorrhage present, small Maternal uterus/adnexae: RIGHT ovary unremarkable. Nonvisualization of LEFT ovary. No free pelvic fluid or adnexal masses. Questionable leiomyoma at upper uterine segment 5.3 cm diameter. IMPRESSION: Gestational sac seen previously at the upper uterine segment of the endometrial canal is now smaller in size and located at the lower uterine segment. Findings are consistent  with a probably failed pregnancy. Small amount of subchronic hemorrhage. Question uterine leiomyoma. Electronically Signed   By: Ulyses Southward M.D.   On: 07/05/2022 18:57    Assessment & Plan:   Tachycardia- Her labs are negative for secondary causes. -     Basic metabolic panel; Future -     CBC with Differential/Platelet; Future -     TSH; Future -     Hepatic function panel; Future  Moderate episode of recurrent major depressive disorder (HCC)- Labs are negative for secondary causes.  She does not want to start taking an antidepressant. -     Basic metabolic panel; Future -     CBC with Differential/Platelet; Future -     TSH; Future -     Hepatic function panel; Future     Follow-up: Return if symptoms worsen or fail to improve.  Sanda Linger, MD

## 2022-07-14 DIAGNOSIS — F4312 Post-traumatic stress disorder, chronic: Secondary | ICD-10-CM | POA: Diagnosis not present

## 2022-07-14 DIAGNOSIS — F4323 Adjustment disorder with mixed anxiety and depressed mood: Secondary | ICD-10-CM | POA: Diagnosis not present

## 2022-07-14 NOTE — Telephone Encounter (Signed)
Forms has been faxed.

## 2022-07-17 ENCOUNTER — Ambulatory Visit: Payer: BC Managed Care – PPO | Admitting: Internal Medicine

## 2022-07-17 ENCOUNTER — Telehealth: Payer: Self-pay

## 2022-07-17 DIAGNOSIS — O039 Complete or unspecified spontaneous abortion without complication: Secondary | ICD-10-CM

## 2022-07-17 DIAGNOSIS — O209 Hemorrhage in early pregnancy, unspecified: Secondary | ICD-10-CM

## 2022-07-17 NOTE — Telephone Encounter (Signed)
Kaylee Hunt called [facility] line stating she took Cytotec for a miscarriage she states she never had any bleeding and didn't know if she needed another round of Cytotec?  Please advise?

## 2022-07-18 ENCOUNTER — Ambulatory Visit: Payer: BC Managed Care – PPO | Admitting: Internal Medicine

## 2022-07-18 ENCOUNTER — Other Ambulatory Visit: Payer: Self-pay

## 2022-07-18 DIAGNOSIS — O039 Complete or unspecified spontaneous abortion without complication: Secondary | ICD-10-CM

## 2022-07-18 DIAGNOSIS — O021 Missed abortion: Secondary | ICD-10-CM

## 2022-07-18 MED ORDER — MISOPROSTOL 200 MCG PO TABS
ORAL_TABLET | ORAL | 0 refills | Status: DC
Start: 2022-07-18 — End: 2022-08-02

## 2022-07-25 NOTE — Telephone Encounter (Signed)
I contacted the patient via phone. She is scheduled for tomorrow 5/29 at 2:40 for labs and ultrasound [facility] at 3:15. The patient is aware of location,dates and times.

## 2022-07-25 NOTE — Telephone Encounter (Signed)
Spoke with patient. She reported bleeding once Sunday but it was light, has had ongoing cramping. Per Dr. Logan Bores she needs an ultrasound and a beta. Patient transferred to scheduling.

## 2022-07-25 NOTE — Telephone Encounter (Signed)
Pt calling; still having complication on passing everything from miscarriage; has taken medication twice with no results.  [phone]

## 2022-07-25 NOTE — Addendum Note (Signed)
Addended by: Loman Chroman on: 07/25/2022 10:36 AM   Modules accepted: Orders

## 2022-07-26 ENCOUNTER — Ambulatory Visit
Admission: RE | Admit: 2022-07-26 | Discharge: 2022-07-26 | Disposition: A | Discharge status: Home / Self Care | Payer: BC Managed Care – PPO | Source: Ambulatory Visit | Attending: Obstetrics and Gynecology | Admitting: Obstetrics and Gynecology

## 2022-07-26 ENCOUNTER — Other Ambulatory Visit: Payer: BC Managed Care – PPO

## 2022-07-26 ENCOUNTER — Encounter: Payer: Self-pay | Admitting: Obstetrics and Gynecology

## 2022-07-26 DIAGNOSIS — O200 Threatened abortion: Secondary | ICD-10-CM | POA: Diagnosis not present

## 2022-07-26 DIAGNOSIS — O209 Hemorrhage in early pregnancy, unspecified: Secondary | ICD-10-CM | POA: Insufficient documentation

## 2022-07-26 DIAGNOSIS — O039 Complete or unspecified spontaneous abortion without complication: Secondary | ICD-10-CM | POA: Insufficient documentation

## 2022-07-27 ENCOUNTER — Encounter: Payer: Self-pay | Admitting: Internal Medicine

## 2022-07-27 LAB — BETA HCG QUANT (REF LAB): hCG Quant: 75 m[IU]/mL

## 2022-07-29 DIAGNOSIS — I10 Essential (primary) hypertension: Secondary | ICD-10-CM | POA: Diagnosis not present

## 2022-08-02 ENCOUNTER — Encounter: Payer: Self-pay | Admitting: Obstetrics and Gynecology

## 2022-08-02 ENCOUNTER — Ambulatory Visit: Payer: BC Managed Care – PPO | Admitting: Obstetrics and Gynecology

## 2022-08-02 ENCOUNTER — Ambulatory Visit (INDEPENDENT_AMBULATORY_CARE_PROVIDER_SITE_OTHER): Payer: BC Managed Care – PPO | Admitting: Obstetrics and Gynecology

## 2022-08-02 VITALS — BP 127/74 | HR 83 | Ht 64.0 in | Wt 185.4 lb

## 2022-08-02 DIAGNOSIS — O039 Complete or unspecified spontaneous abortion without complication: Secondary | ICD-10-CM | POA: Diagnosis not present
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
## 2022-08-02 NOTE — Progress Notes (Signed)
Patient presents today to follow-up from recent miscarriage. Reports no bleeding or passing tissue after 2 rounds of Cytotec. Ongoing cramping and back pain over the last week.

## 2022-08-03 DIAGNOSIS — M7918 Myalgia, other site: Secondary | ICD-10-CM | POA: Diagnosis not present

## 2022-08-03 DIAGNOSIS — M533 Sacrococcygeal disorders, not elsewhere classified: Secondary | ICD-10-CM | POA: Diagnosis not present

## 2022-08-03 DIAGNOSIS — G894 Chronic pain syndrome: Secondary | ICD-10-CM | POA: Diagnosis not present

## 2022-08-03 DIAGNOSIS — M47816 Spondylosis without myelopathy or radiculopathy, lumbar region: Secondary | ICD-10-CM | POA: Diagnosis not present

## 2022-08-04 LAB — BETA HCG QUANT (REF LAB): hCG Quant: 55 m[IU]/mL

## 2022-08-07 ENCOUNTER — Telehealth: Payer: Self-pay | Admitting: Obstetrics and Gynecology

## 2022-08-07 DIAGNOSIS — G894 Chronic pain syndrome: Secondary | ICD-10-CM | POA: Diagnosis not present

## 2022-08-07 NOTE — Telephone Encounter (Signed)
Patient called inquiring about a date to have her D&C procedure. Please advise.

## 2022-08-09 ENCOUNTER — Telehealth: Payer: Self-pay | Admitting: *Deleted

## 2022-08-09 NOTE — Telephone Encounter (Signed)
Call returned to patient.  Patient is currently under care of OBGY for miscarriage, scheduled for D&C on 08/14/22 and OV with TW on 08/17/22.   Patient is scheduled for OV on 08/17/22 for repeat pap. Per review of EPIC, last PAP with [facility] on 02/16/22, normal with neg Hpv, next PAP 3 years. Scheduled [facility]EX for 03/01/23 with TW. Advised I will send to TW to review and our office will return call if any additional recommendations.  Advised patient she will need to complete care with OBGYN, once cleared may return back to [facility]. Patient verbalizes understanding and is agreeable.  Routing to [employer] for review.

## 2022-08-11 ENCOUNTER — Ambulatory Visit (INDEPENDENT_AMBULATORY_CARE_PROVIDER_SITE_OTHER): Payer: BC Managed Care – PPO | Admitting: Obstetrics and Gynecology

## 2022-08-11 ENCOUNTER — Encounter: Payer: Self-pay | Admitting: Obstetrics and Gynecology

## 2022-08-11 VITALS — BP 139/85 | HR 80 | Ht 64.0 in | Wt 187.0 lb

## 2022-08-11 DIAGNOSIS — Z01818 Encounter for other preprocedural examination: Secondary | ICD-10-CM | POA: Diagnosis not present

## 2022-08-11 DIAGNOSIS — O039 Complete or unspecified spontaneous abortion without complication: Secondary | ICD-10-CM
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
## 2022-08-13 MED ORDER — CHLORHEXIDINE GLUCONATE 0.12 % MT SOLN
15.0000 mL | Freq: Once | OROMUCOSAL | Status: AC
  Administered 2022-08-14: 15 mL via OROMUCOSAL

## 2022-08-13 MED ORDER — LACTATED RINGERS IV SOLN: INTRAVENOUS | Status: DC

## 2022-08-13 MED ORDER — ORAL CARE MOUTH RINSE: 15.0000 mL | Freq: Once | OROMUCOSAL | Status: AC

## 2022-08-13 MED ORDER — POVIDONE-IODINE 10 % EX SWAB: 2.0000 | Freq: Once | CUTANEOUS | Status: DC

## 2022-08-14 ENCOUNTER — Other Ambulatory Visit: Payer: Self-pay

## 2022-08-14 ENCOUNTER — Encounter: Payer: Self-pay | Admitting: Obstetrics and Gynecology

## 2022-08-14 ENCOUNTER — Ambulatory Visit
Admission: RE | Admit: 2022-08-14 | Discharge: 2022-08-14 | Disposition: A | Discharge status: Home / Self Care | Payer: BC Managed Care – PPO | Attending: Obstetrics and Gynecology | Admitting: Obstetrics and Gynecology

## 2022-08-14 ENCOUNTER — Ambulatory Visit: Payer: BC Managed Care – PPO | Admitting: General Practice

## 2022-08-14 ENCOUNTER — Encounter
Admission: RE | Disposition: A | Discharge status: Home / Self Care | Payer: Self-pay | Source: Home / Self Care | Attending: Obstetrics and Gynecology

## 2022-08-14 DIAGNOSIS — O034 Incomplete spontaneous abortion without complication: Secondary | ICD-10-CM | POA: Diagnosis not present

## 2022-08-14 DIAGNOSIS — Z87891 Personal history of nicotine dependence: Secondary | ICD-10-CM | POA: Diagnosis not present

## 2022-08-14 DIAGNOSIS — Z79899 Other long term (current) drug therapy: Secondary | ICD-10-CM | POA: Insufficient documentation

## 2022-08-14 DIAGNOSIS — O021 Missed abortion: Secondary | ICD-10-CM | POA: Diagnosis not present

## 2022-08-14 DIAGNOSIS — Z01818 Encounter for other preprocedural examination: Secondary | ICD-10-CM

## 2022-08-14 DIAGNOSIS — O039 Complete or unspecified spontaneous abortion without complication: Secondary | ICD-10-CM

## 2022-08-14 DIAGNOSIS — Z3A01 Less than 8 weeks gestation of pregnancy: Secondary | ICD-10-CM | POA: Diagnosis not present

## 2022-08-14 DIAGNOSIS — O364XX0 Maternal care for intrauterine death, not applicable or unspecified: Secondary | ICD-10-CM | POA: Diagnosis not present

## 2022-08-14 HISTORY — PX: DILATION AND EVACUATION: SHX1459

## 2022-08-14 LAB — TYPE AND SCREEN
ABO/RH(D): A POS
Antibody Screen: NEGATIVE

## 2022-08-14 SURGERY — DILATION AND EVACUATION, UTERUS, SECOND TRIMESTER
Anesthesia: General

## 2022-08-14 MED ORDER — MIDAZOLAM HCL 2 MG/2ML IJ SOLN
INTRAMUSCULAR | Status: AC
  Filled 2022-08-14: qty 2

## 2022-08-14 MED ORDER — EPHEDRINE SULFATE (PRESSORS) 50 MG/ML IJ SOLN
INTRAMUSCULAR | Status: DC | PRN
  Administered 2022-08-14: 10 mg via INTRAVENOUS

## 2022-08-14 MED ORDER — LIDOCAINE HCL (PF) 2 % IJ SOLN
INTRAMUSCULAR | Status: AC
  Filled 2022-08-14: qty 5

## 2022-08-14 MED ORDER — PROPOFOL 10 MG/ML IV BOLUS
INTRAVENOUS | Status: DC | PRN
  Administered 2022-08-14: 200 mg via INTRAVENOUS

## 2022-08-14 MED ORDER — ONDANSETRON HCL 4 MG/2ML IJ SOLN
INTRAMUSCULAR | Status: DC | PRN
  Administered 2022-08-14: 4 mg via INTRAVENOUS

## 2022-08-14 MED ORDER — IBUPROFEN 800 MG PO TABS: 800.0000 mg | ORAL_TABLET | Freq: Three times a day (TID) | ORAL | 0 refills | Status: DC | PRN

## 2022-08-14 MED ORDER — ACETAMINOPHEN 10 MG/ML IV SOLN
INTRAVENOUS | Status: DC | PRN
  Administered 2022-08-14: 1000 mg via INTRAVENOUS

## 2022-08-14 MED ORDER — ONDANSETRON HCL 4 MG/2ML IJ SOLN
INTRAMUSCULAR | Status: AC
  Filled 2022-08-14: qty 2

## 2022-08-14 MED ORDER — PROPOFOL 10 MG/ML IV BOLUS
INTRAVENOUS | Status: AC
  Filled 2022-08-14: qty 40

## 2022-08-14 MED ORDER — FENTANYL CITRATE (PF) 100 MCG/2ML IJ SOLN
25.0000 ug | INTRAMUSCULAR | Status: DC | PRN
  Administered 2022-08-14 (×2): 25 ug via INTRAVENOUS

## 2022-08-14 MED ORDER — OXYTOCIN 10 UNIT/ML IJ SOLN
INTRAMUSCULAR | Status: AC
  Filled 2022-08-14: qty 2

## 2022-08-14 MED ORDER — OXYCODONE HCL 5 MG PO TABS
5.0000 mg | ORAL_TABLET | Freq: Once | ORAL | Status: AC | PRN
  Administered 2022-08-14: 5 mg via ORAL

## 2022-08-14 MED ORDER — DEXAMETHASONE SODIUM PHOSPHATE 10 MG/ML IJ SOLN
INTRAMUSCULAR | Status: DC | PRN
  Administered 2022-08-14: 10 mg via INTRAVENOUS

## 2022-08-14 MED ORDER — OXYCODONE HCL 5 MG PO TABS
ORAL_TABLET | ORAL | Status: AC
  Filled 2022-08-14: qty 1

## 2022-08-14 MED ORDER — OXYCODONE HCL 5 MG/5ML PO SOLN: 5.0000 mg | Freq: Once | ORAL | Status: AC | PRN

## 2022-08-14 MED ORDER — HYDROCODONE-ACETAMINOPHEN 5-325 MG PO TABS
1.0000 | ORAL_TABLET | Freq: Four times a day (QID) | ORAL | 0 refills | Status: DC | PRN
Start: 2022-08-14 — End: 2022-08-29

## 2022-08-14 MED ORDER — PHENYLEPHRINE 80 MCG/ML (10ML) SYRINGE FOR IV PUSH (FOR BLOOD PRESSURE SUPPORT)
PREFILLED_SYRINGE | INTRAVENOUS | Status: AC
  Filled 2022-08-14: qty 10

## 2022-08-14 MED ORDER — FENTANYL CITRATE (PF) 100 MCG/2ML IJ SOLN
INTRAMUSCULAR | Status: AC
  Filled 2022-08-14: qty 2

## 2022-08-14 MED ORDER — DEXAMETHASONE SODIUM PHOSPHATE 10 MG/ML IJ SOLN
INTRAMUSCULAR | Status: AC
  Filled 2022-08-14: qty 1

## 2022-08-14 MED ORDER — 0.9 % SODIUM CHLORIDE (POUR BTL) OPTIME
TOPICAL | Status: DC | PRN
  Administered 2022-08-14: 500 mL

## 2022-08-14 MED ORDER — MIDAZOLAM HCL 2 MG/2ML IJ SOLN
INTRAMUSCULAR | Status: DC | PRN
  Administered 2022-08-14: 2 mg via INTRAVENOUS

## 2022-08-14 MED ORDER — EPHEDRINE 5 MG/ML INJ
INTRAVENOUS | Status: AC
  Filled 2022-08-14: qty 5

## 2022-08-14 MED ORDER — ACETAMINOPHEN 10 MG/ML IV SOLN
INTRAVENOUS | Status: AC
  Filled 2022-08-14: qty 100

## 2022-08-14 MED ORDER — FENTANYL CITRATE (PF) 100 MCG/2ML IJ SOLN
INTRAMUSCULAR | Status: DC | PRN
  Administered 2022-08-14 (×4): 25 ug via INTRAVENOUS

## 2022-08-14 MED ORDER — LIDOCAINE HCL (CARDIAC) PF 100 MG/5ML IV SOSY
PREFILLED_SYRINGE | INTRAVENOUS | Status: DC | PRN
  Administered 2022-08-14: 100 mg via INTRAVENOUS

## 2022-08-14 MED ORDER — LACTATED RINGERS IV SOLN: INTRAVENOUS | Status: DC

## 2022-08-14 SURGICAL SUPPLY — 28 items
CUP MEDICINE 2OZ PLAST GRAD ST (MISCELLANEOUS) ×1 IMPLANT
DRSG TELFA 3X8 NADH STRL (GAUZE/BANDAGES/DRESSINGS) ×1 IMPLANT
FILTER UTR ASPR SPEC (MISCELLANEOUS) ×1 IMPLANT
FLTR UTR ASPR SPEC (MISCELLANEOUS) ×1
GLOVE PI ORTHO PRO STRL 7.5 (GLOVE) ×1 IMPLANT
GOWN STRL REUS W/ TWL LRG LVL3 (GOWN DISPOSABLE) ×2 IMPLANT
GOWN STRL REUS W/TWL LRG LVL3 (GOWN DISPOSABLE) ×2
GRADUATE 1200CC STRL 31836 (MISCELLANEOUS) ×1 IMPLANT
KIT BERKELEY 1ST TRIMESTER 3/8 (MISCELLANEOUS) ×1 IMPLANT
KIT TURNOVER KIT A (KITS) ×1 IMPLANT
MANIFOLD NEPTUNE II (INSTRUMENTS) ×1 IMPLANT
NDL HYPO 25X1 1.5 SAFETY (NEEDLE) ×1 IMPLANT
NEEDLE HYPO 25X1 1.5 SAFETY (NEEDLE) ×1 IMPLANT
NS IRRIG 500ML POUR BTL (IV SOLUTION) IMPLANT
PACK DNC HYST (MISCELLANEOUS) ×1 IMPLANT
PAD OB MATERNITY 4.3X12.25 (PERSONAL CARE ITEMS) ×1 IMPLANT
PAD PREP OB/GYN DISP 24X41 (PERSONAL CARE ITEMS) ×1 IMPLANT
SCRUB CHG 4% DYNA-HEX 4OZ (MISCELLANEOUS) ×1 IMPLANT
SET BERKELEY SUCTION TUBING (SUCTIONS) ×1 IMPLANT
SET CYSTO W/LG BORE CLAMP LF (SET/KITS/TRAYS/PACK) IMPLANT
SOL PREP PVP 2OZ (MISCELLANEOUS) ×1
SOLUTION PREP PVP 2OZ (MISCELLANEOUS) ×1 IMPLANT
TRAP FLUID SMOKE EVACUATOR (MISCELLANEOUS) ×1 IMPLANT
VACURETTE 10 RIGID CVD (CANNULA) IMPLANT
VACURETTE 12 RIGID CVD (CANNULA) IMPLANT
VACURETTE 7MM F TIP STRL (CANNULA) IMPLANT
VACURETTE 8 RIGID CVD (CANNULA) IMPLANT
WATER STERILE IRR 500ML POUR (IV SOLUTION) ×1 IMPLANT

## 2022-08-14 NOTE — Anesthesia Procedure Notes (Signed)
Procedure Name: LMA Insertion Date/Time: 08/14/2022 8:22 AM  Performed by: Stephanie Coup, MDPre-anesthesia Checklist: Patient identified, Patient being monitored, Timeout performed, Emergency Drugs available and Suction available Patient Re-evaluated:Patient Re-evaluated prior to induction Oxygen Delivery Method: Circle system utilized Preoxygenation: Pre-oxygenation with 100% oxygen Induction Type: IV induction Ventilation: Mask ventilation without difficulty LMA: LMA inserted LMA Size: 4.0 Tube type: Oral Number of attempts: 1 Placement Confirmation: positive ETCO2 and breath sounds checked- equal and bilateral Tube secured with: Tape Dental Injury: Teeth and Oropharynx as per pre-operative assessment

## 2022-08-14 NOTE — Transfer of Care (Signed)
Immediate Anesthesia Transfer of Care Note  Patient: Kaylee Hunt  Procedure(s) Performed: DILATATION AND EVACUATION (D&E)  Patient Location: PACU  Anesthesia Type:General  Level of Consciousness: awake, alert , and oriented  Airway & Oxygen Therapy: Patient Spontanous Breathing  Post-op Assessment: Report given to RN  Post vital signs: Reviewed  Last Vitals:  Vitals Value Taken Time  BP 126/76 08/14/22 0856  Temp 36.7 C 08/14/22 0856  Pulse 93 08/14/22 0859  Resp 13 08/14/22 0859  SpO2 100 % 08/14/22 0859  Vitals shown include unvalidated device data.  Last Pain:  Vitals:   08/14/22 0856  TempSrc:   PainSc: Asleep         Complications: No notable events documented.

## 2022-08-14 NOTE — Discharge Instructions (Signed)
AMBULATORY SURGERY  ?DISCHARGE INSTRUCTIONS ? ? ?The drugs that you were given will stay in your system until tomorrow so for the next 24 hours you should not: ? ?Drive an automobile ?Make any legal decisions ?Drink any alcoholic beverage ? ? ?You may resume regular meals tomorrow.  Today it is better to start with liquids and gradually work up to solid foods. ? ?You may eat anything you prefer, but it is better to start with liquids, then soup and crackers, and gradually work up to solid foods. ? ? ?Please notify your doctor immediately if you have any unusual bleeding, trouble breathing, redness and pain at the surgery site, drainage, fever, or pain not relieved by medication. ? ? ? ?Additional Instructions: ? ? ? ?Please contact your physician with any problems or Same Day Surgery at [phone], Monday through Friday 6 am to 4 pm, or [place] [facility] number at [phone].  ?

## 2022-08-14 NOTE — Anesthesia Preprocedure Evaluation (Signed)
Anesthesia Evaluation  Patient identified by MRN, date of birth, ID band Patient awake    Reviewed: Allergy & Precautions, NPO status , Patient's Chart, lab work & pertinent test results  History of Anesthesia Complications Negative for: history of anesthetic complications  Airway Mallampati: II  TM Distance: >3 FB Neck ROM: full    Dental  (+) Dental Advidsory Given   Pulmonary neg pulmonary ROS, neg shortness of breath, neg COPD, former smoker   Pulmonary exam normal        Cardiovascular (-) angina negative cardio ROS Normal cardiovascular exam(-) dysrhythmias      Neuro/Psych  PSYCHIATRIC DISORDERS      negative neurological ROS     GI/Hepatic negative GI ROS, Neg liver ROS,,,  Endo/Other  negative endocrine ROS    Renal/GU      Musculoskeletal   Abdominal   Peds  Hematology negative hematology ROS (+)   Anesthesia Other Findings Past Medical History: No date: Abnormal Pap smear of cervix 09/10/2014: History of poor fetal growth 08/19/2014: History of premature delivery     Comment:  31 4/7 weeks due to HELLP and IUGR, breech 09/10/2014: Hx of preeclampsia, prior pregnancy, currently pregnant  Past Surgical History: No date: CESAREAN SECTION No date: COLPOSCOPY 08/04/2020: LEEP     Comment:  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 No date: WISDOM TOOTH EXTRACTION     Comment:  four; age 37     Reproductive/Obstetrics negative OB ROS                             Anesthesia Physical Anesthesia Plan  ASA: 2  Anesthesia Plan: General LMA   Post-op Pain Management: Ofirmev IV (intra-op)*   Induction: Intravenous  PONV Risk Score and Plan: 3 and Ondansetron, Dexamethasone and Midazolam  Airway Management Planned: LMA  Additional Equipment:   Intra-op Plan:   Post-operative Plan: Extubation in OR  Informed Consent: I have reviewed the patients History and  Physical, chart, labs and discussed the procedure including the risks, benefits and alternatives for the proposed anesthesia with the patient or authorized representative who has indicated his/her understanding and acceptance.     Dental Advisory Given  Plan Discussed with: Anesthesiologist, CRNA and Surgeon  Anesthesia Plan Comments: (Patient consented for risks of anesthesia including but not limited to:  - adverse reactions to medications - damage to eyes, teeth, lips or other oral mucosa - nerve damage due to positioning  - sore throat or hoarseness - Damage to heart, brain, nerves, lungs, other parts of body or loss of life  Patient voiced understanding.)       Anesthesia Quick Evaluation

## 2022-08-14 NOTE — Interval H&P Note (Signed)
History and Physical Interval Note:  08/14/2022 8:20 AM  Kaylee Hunt  has presented today for surgery, with the diagnosis of Retained products of conception.  The various methods of treatment have been discussed with the patient and family. After consideration of risks, benefits and other options for treatment, the patient has consented to  Procedure(s): DILATATION AND EVACUATION (D&E) (N/A) as a surgical intervention.  The patient's history has been reviewed, patient examined, no change in status, stable for surgery.  I have reviewed the patient's chart and labs.  Questions were answered to the patient's satisfaction.     Brennan Bailey

## 2022-08-14 NOTE — Op Note (Signed)
   OPERATIVE NOTE 08/14/2022 8:45 AM  PRE-OPERATIVE DIAGNOSIS:  1) Retained products of conception  POST-OPERATIVE DIAGNOSIS:  * No Diagnosis Codes entered *  OPERATION:  D&E/C  SURGEON(S): Surgeon(s) and Role:    Linzie Collin, MD - Primary   ANESTHESIA: Choice  ESTIMATED BLOOD LOSS: 20mL  OPERATIVE FINDINGS: Retained POC  SPECIMEN:  ID Type Source Tests Collected by Time Destination  1 : retained products of conception Products of Conception PATH POC SURGICAL PATHOLOGY Linzie Collin, MD 08/14/2022 0745     COMPLICATIONS: None  DRAINS: None  DISPOSITION: Stable to recovery room  DESCRIPTION OF PROCEDURE:      The patient was prepped and draped in the dorsal lithotomy position and placed under general anesthesia. Her bladder was emptied.  The cervix was grasped with a Jacob's tenaculum. Respecting the position and curvature of her cervix, it was dilated to accommodate a number 8 suction curette. The suction curette was placed within the endometrial cavity and a pressure greater than 65 mmHg was allowed to build. A systematic curettage was performed in all quadrants until no additional tissue was noted. The uterus became firm and globular. The tenaculum was removed from the cervix and hemostasis was noted. The weighted speculum was removed and the patient went to recovery room in stable condition.   Elonda Husky, M.D. 08/14/2022 8:45 AM

## 2022-08-14 NOTE — Anesthesia Postprocedure Evaluation (Signed)
Anesthesia Post Note  Patient: Kaylee Hunt  Procedure(s) Performed: DILATATION AND EVACUATION (D&E)  Patient location during evaluation: PACU Anesthesia Type: General Level of consciousness: awake and alert Pain management: pain level controlled Vital Signs Assessment: post-procedure vital signs reviewed and stable Respiratory status: spontaneous breathing, nonlabored ventilation, respiratory function stable and patient connected to nasal cannula oxygen Cardiovascular status: blood pressure returned to baseline and stable Postop Assessment: no apparent nausea or vomiting Anesthetic complications: no  No notable events documented.   Last Vitals:  Vitals:   08/14/22 0731 08/14/22 0856  BP: 138/87 126/76  Pulse: 94 (!) 105  Resp: 17 15  Temp: 36.7 C 36.7 C  SpO2: 97% 98%    Last Pain:  Vitals:   08/14/22 0856  TempSrc:   PainSc: Asleep                 Stephanie Coup

## 2022-08-15 ENCOUNTER — Encounter: Payer: Self-pay | Admitting: Obstetrics and Gynecology

## 2022-08-17 ENCOUNTER — Ambulatory Visit: Payer: BC Managed Care – PPO | Admitting: Nurse Practitioner

## 2022-08-25 NOTE — Telephone Encounter (Signed)
Tiffany -ok to close encounter?  

## 2022-08-27 NOTE — Telephone Encounter (Signed)
Yes, OK to close. Thanks.  

## 2022-08-28 ENCOUNTER — Telehealth: Payer: Self-pay

## 2022-08-28 DIAGNOSIS — I10 Essential (primary) hypertension: Secondary | ICD-10-CM | POA: Diagnosis not present

## 2022-08-28 NOTE — Telephone Encounter (Signed)
Pt called triage, she is having really bad discharge, on/off diarrhea since last week, and started with slight pain this morning. She has post-op appointment with Dr. Logan Bores tomorrow. I advised discharge is probably from the procedure. She should take imodium (which she is already) and take tylenol for the pain. Also advised if pain gets severe to go to nearest ER.

## 2022-08-29 ENCOUNTER — Ambulatory Visit (INDEPENDENT_AMBULATORY_CARE_PROVIDER_SITE_OTHER): Payer: BC Managed Care – PPO | Admitting: Obstetrics and Gynecology

## 2022-08-29 ENCOUNTER — Encounter: Payer: Self-pay | Admitting: Obstetrics and Gynecology

## 2022-08-29 ENCOUNTER — Other Ambulatory Visit (HOSPITAL_COMMUNITY)
Admission: RE | Admit: 2022-08-29 | Discharge: 2022-08-29 | Disposition: A | Discharge status: Home / Self Care | Payer: BC Managed Care – PPO | Source: Ambulatory Visit | Attending: Obstetrics and Gynecology | Admitting: Obstetrics and Gynecology

## 2022-08-29 VITALS — BP 121/80 | HR 76 | Ht 64.0 in | Wt 186.7 lb

## 2022-08-29 DIAGNOSIS — Z30011 Encounter for initial prescription of contraceptive pills: Secondary | ICD-10-CM

## 2022-08-29 DIAGNOSIS — O021 Missed abortion: Secondary | ICD-10-CM

## 2022-08-29 DIAGNOSIS — N898 Other specified noninflammatory disorders of vagina: Secondary | ICD-10-CM | POA: Insufficient documentation

## 2022-08-29 DIAGNOSIS — Z8742 Personal history of other diseases of the female genital tract: Secondary | ICD-10-CM | POA: Diagnosis not present

## 2022-08-29 DIAGNOSIS — Z4889 Encounter for other specified surgical aftercare: Secondary | ICD-10-CM

## 2022-08-29 DIAGNOSIS — L292 Pruritus vulvae: Secondary | ICD-10-CM | POA: Diagnosis not present

## 2022-08-29 DIAGNOSIS — Z9889 Other specified postprocedural states: Secondary | ICD-10-CM | POA: Diagnosis not present

## 2022-08-29 MED ORDER — DESOGESTREL-ETHINYL ESTRADIOL 0.15-0.02/0.01 MG (21/5) PO TABS
1.0000 | ORAL_TABLET | Freq: Every day | ORAL | 3 refills | Status: DC
Start: 2022-08-29 — End: 2023-07-25
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
## 2022-08-29 NOTE — Progress Notes (Signed)
Patient presents for 2 week postop follow-up following D&C. She states experiencing white vaginal discharge along with itching over the last week. Reports having diarrhea since procedure.

## 2022-08-30 LAB — BETA HCG QUANT (REF LAB): hCG Quant: <1 m[IU]/mL

## 2022-09-01 LAB — CERVICOVAGINAL ANCILLARY ONLY
Candida Glabrata: NEGATIVE
Candida Vaginitis: NEGATIVE
Comment: NEGATIVE
Comment: NEGATIVE

## 2022-09-07 DIAGNOSIS — F411 Generalized anxiety disorder: Secondary | ICD-10-CM | POA: Diagnosis not present

## 2022-09-25 ENCOUNTER — Encounter: Payer: Self-pay | Admitting: Nurse Practitioner

## 2022-09-25 NOTE — Telephone Encounter (Signed)
Agree with plan for OV. Thank you.
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
## 2022-09-28 DIAGNOSIS — I10 Essential (primary) hypertension: Secondary | ICD-10-CM | POA: Diagnosis not present

## 2022-10-05 DIAGNOSIS — F411 Generalized anxiety disorder: Secondary | ICD-10-CM | POA: Diagnosis not present

## 2022-10-07 ENCOUNTER — Other Ambulatory Visit: Payer: Self-pay

## 2022-10-07 ENCOUNTER — Emergency Department: Payer: BC Managed Care – PPO

## 2022-10-07 DIAGNOSIS — S93402A Sprain of unspecified ligament of left ankle, initial encounter: Secondary | ICD-10-CM | POA: Insufficient documentation

## 2022-10-07 DIAGNOSIS — M7989 Other specified soft tissue disorders: Secondary | ICD-10-CM | POA: Diagnosis not present

## 2022-10-07 DIAGNOSIS — Y9389 Activity, other specified: Secondary | ICD-10-CM | POA: Insufficient documentation

## 2022-10-07 DIAGNOSIS — S99912A Unspecified injury of left ankle, initial encounter: Secondary | ICD-10-CM | POA: Diagnosis not present

## 2022-10-07 DIAGNOSIS — X501XXA Overexertion from prolonged static or awkward postures, initial encounter: Secondary | ICD-10-CM | POA: Insufficient documentation

## 2022-10-07 DIAGNOSIS — R000 Tachycardia, unspecified: Secondary | ICD-10-CM | POA: Diagnosis not present
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
## 2022-10-08 ENCOUNTER — Emergency Department
Admission: EM | Admit: 2022-10-08 | Discharge: 2022-10-08 | Disposition: A | Discharge status: Home / Self Care | Payer: BC Managed Care – PPO | Source: Home / Self Care | Attending: Emergency Medicine | Admitting: Emergency Medicine

## 2022-10-08 DIAGNOSIS — S93402A Sprain of unspecified ligament of left ankle, initial encounter: Secondary | ICD-10-CM

## 2022-10-08 MED ORDER — HYDROCODONE-ACETAMINOPHEN 5-325 MG PO TABS
2.0000 | ORAL_TABLET | Freq: Once | ORAL | Status: AC
  Administered 2022-10-08: 2 via ORAL
  Filled 2022-10-08: qty 2

## 2022-10-08 MED ORDER — TRAMADOL HCL 50 MG PO TABS: 100.0000 mg | ORAL_TABLET | Freq: Two times a day (BID) | ORAL | 0 refills | Status: DC | PRN
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
## 2022-10-08 NOTE — Discharge Instructions (Addendum)

## 2022-10-10 ENCOUNTER — Ambulatory Visit: Payer: BC Managed Care – PPO | Admitting: Nurse Practitioner

## 2022-10-11 DIAGNOSIS — L732 Hidradenitis suppurativa: Secondary | ICD-10-CM | POA: Diagnosis not present

## 2022-10-11 DIAGNOSIS — Z6831 Body mass index (BMI) 31.0-31.9, adult: Secondary | ICD-10-CM | POA: Diagnosis not present

## 2022-10-11 DIAGNOSIS — S93402A Sprain of unspecified ligament of left ankle, initial encounter: Secondary | ICD-10-CM | POA: Diagnosis not present

## 2022-10-18 DIAGNOSIS — Z6831 Body mass index (BMI) 31.0-31.9, adult: Secondary | ICD-10-CM | POA: Diagnosis not present

## 2022-10-18 DIAGNOSIS — S93402D Sprain of unspecified ligament of left ankle, subsequent encounter: Secondary | ICD-10-CM | POA: Diagnosis not present

## 2022-10-24 ENCOUNTER — Ambulatory Visit: Payer: BC Managed Care – PPO | Admitting: Nurse Practitioner

## 2022-10-26 DIAGNOSIS — F411 Generalized anxiety disorder: Secondary | ICD-10-CM | POA: Diagnosis not present

## 2022-10-29 DIAGNOSIS — I10 Essential (primary) hypertension: Secondary | ICD-10-CM | POA: Diagnosis not present

## 2022-11-02 DIAGNOSIS — M533 Sacrococcygeal disorders, not elsewhere classified: Secondary | ICD-10-CM | POA: Diagnosis not present

## 2022-11-08 ENCOUNTER — Ambulatory Visit: Payer: BC Managed Care – PPO | Admitting: Internal Medicine

## 2022-11-08 DIAGNOSIS — S93402D Sprain of unspecified ligament of left ankle, subsequent encounter: Secondary | ICD-10-CM | POA: Diagnosis not present

## 2022-11-08 DIAGNOSIS — Z6831 Body mass index (BMI) 31.0-31.9, adult: Secondary | ICD-10-CM | POA: Diagnosis not present

## 2022-11-22 DIAGNOSIS — L732 Hidradenitis suppurativa: Secondary | ICD-10-CM | POA: Diagnosis not present

## 2022-11-30 DIAGNOSIS — F411 Generalized anxiety disorder: Secondary | ICD-10-CM | POA: Diagnosis not present

## 2022-12-06 DIAGNOSIS — Z6831 Body mass index (BMI) 31.0-31.9, adult: Secondary | ICD-10-CM | POA: Diagnosis not present

## 2022-12-06 DIAGNOSIS — S93402D Sprain of unspecified ligament of left ankle, subsequent encounter: Secondary | ICD-10-CM | POA: Diagnosis not present

## 2022-12-07 DIAGNOSIS — M533 Sacrococcygeal disorders, not elsewhere classified: Secondary | ICD-10-CM | POA: Diagnosis not present

## 2022-12-14 DIAGNOSIS — F909 Attention-deficit hyperactivity disorder, unspecified type: Secondary | ICD-10-CM | POA: Diagnosis not present

## 2023-01-10 DIAGNOSIS — M533 Sacrococcygeal disorders, not elsewhere classified: Secondary | ICD-10-CM | POA: Diagnosis not present

## 2023-01-11 DIAGNOSIS — F411 Generalized anxiety disorder: Secondary | ICD-10-CM | POA: Diagnosis not present

## 2023-01-19 DIAGNOSIS — J00 Acute nasopharyngitis [common cold]: Secondary | ICD-10-CM | POA: Diagnosis not present

## 2023-02-07 DIAGNOSIS — F411 Generalized anxiety disorder: Secondary | ICD-10-CM | POA: Diagnosis not present

## 2023-02-13 ENCOUNTER — Other Ambulatory Visit (HOSPITAL_COMMUNITY)
Admission: RE | Admit: 2023-02-13 | Discharge: 2023-02-13 | Disposition: A | Discharge status: Home / Self Care | Payer: BC Managed Care – PPO | Source: Ambulatory Visit | Attending: Nurse Practitioner | Admitting: Nurse Practitioner

## 2023-02-13 ENCOUNTER — Ambulatory Visit (INDEPENDENT_AMBULATORY_CARE_PROVIDER_SITE_OTHER): Payer: BC Managed Care – PPO | Admitting: Nurse Practitioner

## 2023-02-13 VITALS — BP 126/74 | HR 94 | Ht 64.0 in | Wt 170.0 lb

## 2023-02-13 DIAGNOSIS — Z87410 Personal history of cervical dysplasia: Secondary | ICD-10-CM | POA: Insufficient documentation

## 2023-02-13 DIAGNOSIS — Z3041 Encounter for surveillance of contraceptive pills: Secondary | ICD-10-CM

## 2023-02-13 DIAGNOSIS — Z01419 Encounter for gynecological examination (general) (routine) without abnormal findings: Secondary | ICD-10-CM | POA: Diagnosis not present

## 2023-02-13 NOTE — Progress Notes (Signed)
Kaylee Hunt [birth year] [id number]   History:  37 y.o. [id number] presents for annual visit. Monthly cycles. Had D&E June 2024 following miscarriage. Started OCPs per derm since she is on spironolactone and doxy for acne. Planning to finish acne treatment in a couple of months and try to conceive. 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  Anxiety/depression managed by PCP. PHQ is a 10 today. History of fibroadenoma.   Gynecologic History Patient's last menstrual period was 02/11/2023. Period Cycle (Days): 28 Period Duration (Days): 4-5 Period Pattern: Regular Menstrual Flow: Heavy Menstrual Control: Maxi pad Menstrual Control Change Freq (Hours): 4-6 Dysmenorrhea: (!) Mild Dysmenorrhea Symptoms: Cramping Contraception/Family planning: OCP (estrogen/progesterone) Sexually active: Yes  Health Maintenance Last Pap: 02/16/2022. Results were: Normal neg HPV Last mammogram: 07/06/2022. Results were: Benign right breast mass Last colonoscopy: Not indicated Last Dexa: Not indicated  Past medical history, past surgical history, family history and social history were all reviewed and documented in the EPIC chart. Boyfriend. Works at [employer]. 35 yo daughter, in 7th grade, plays flute. 58 yo son.  ROS:  A ROS was performed and pertinent positives and negatives are included.  Exam:  Vitals:   02/13/23 1348  BP: 126/74  Pulse: 94  SpO2: 99%  Weight: 170 lb (77.1 kg)  Height: 5\' 4"  (1.626 m)     Body mass index is 29.18 kg/m.  General appearance:  Normal Thyroid:  Symmetrical, normal in size, without palpable masses or nodularity. Respiratory  Auscultation:  Clear without wheezing or rhonchi Cardiovascular  Auscultation:  Regular rate, without rubs, murmurs or gallops  Edema/varicosities:  Not grossly evident Abdominal  Soft,nontender, without masses, guarding or rebound.  Liver/spleen:  No organomegaly noted  Hernia:  None  appreciated  Skin  Inspection:  Grossly normal   Breasts: Examined lying and sitting.   Right: Without masses, retractions, discharge or axillary adenopathy.   Left: Without masses, retractions, discharge or axillary adenopathy. Pelvic: External genitalia:  no lesions              Urethra:  normal appearing urethra with no masses, tenderness or lesions              Bartholins and Skenes: normal                 Vagina: normal appearing vagina with normal color and discharge, no lesions              Cervix: no lesions Bimanual Exam:  Uterus:  no masses or tenderness              Adnexa: no mass, fullness, tenderness              Rectovaginal: Deferred              Anus:  normal, no lesions  Patient informed chaperone available to be present for breast and pelvic exam. Patient has requested no chaperone to be present. Patient has been advised what will be completed during breast and pelvic exam.   Assessment/Plan:  37 y.o. [id number] for annual exam.   Well female exam with routine gynecological exam - Education provided on SBEs, importance of preventative screenings, current guidelines, high calcium diet, regular exercise, and multivitamin daily. Labs with PCP.   History of cervical dysplasia - Plan: Cytology - PAP( [facility]). 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 Normal pap 01/2021, 01/2022. Pap today per guidelines. If normal,  will return to 3-year pap interval.   Encounter for surveillance of contraceptive pills - taking as prescribed. Does not need refills at this time.   Family history of breast cancer in first degree relative - Mother diagnosed with stage 4 breast cancer at age 50. Mother deceased. Recommend genetic testing. Being followed for benign right breast mass. 50-month diagnostic imaging recommended (~May 2025). Normal breast exam today.   Return in about 1 year (around 02/13/2024) for Annual.    Olivia Mackie DNP, 2:23 PM  02/13/2023

## 2023-02-16 DIAGNOSIS — M5416 Radiculopathy, lumbar region: Secondary | ICD-10-CM | POA: Diagnosis not present

## 2023-02-16 LAB — CYTOLOGY - PAP
Comment: NEGATIVE
Diagnosis: NEGATIVE
High risk HPV: NEGATIVE

## 2023-03-01 ENCOUNTER — Ambulatory Visit: Payer: BC Managed Care – PPO | Admitting: Nurse Practitioner

## 2023-03-08 DIAGNOSIS — M5117 Intervertebral disc disorders with radiculopathy, lumbosacral region: Secondary | ICD-10-CM | POA: Diagnosis not present

## 2023-03-08 DIAGNOSIS — M5116 Intervertebral disc disorders with radiculopathy, lumbar region: Secondary | ICD-10-CM | POA: Diagnosis not present

## 2023-03-08 DIAGNOSIS — M48061 Spinal stenosis, lumbar region without neurogenic claudication: Secondary | ICD-10-CM | POA: Diagnosis not present

## 2023-03-08 DIAGNOSIS — M4807 Spinal stenosis, lumbosacral region: Secondary | ICD-10-CM | POA: Diagnosis not present

## 2023-03-09 DIAGNOSIS — F411 Generalized anxiety disorder: Secondary | ICD-10-CM | POA: Diagnosis not present

## 2023-03-13 DIAGNOSIS — J209 Acute bronchitis, unspecified: Secondary | ICD-10-CM | POA: Diagnosis not present

## 2023-03-20 DIAGNOSIS — F411 Generalized anxiety disorder: Secondary | ICD-10-CM | POA: Diagnosis not present

## 2023-03-28 DIAGNOSIS — M5416 Radiculopathy, lumbar region: Secondary | ICD-10-CM | POA: Diagnosis not present

## 2023-04-03 DIAGNOSIS — F411 Generalized anxiety disorder: Secondary | ICD-10-CM | POA: Diagnosis not present

## 2023-04-04 DIAGNOSIS — F411 Generalized anxiety disorder: Secondary | ICD-10-CM | POA: Diagnosis not present

## 2023-04-17 DIAGNOSIS — F411 Generalized anxiety disorder: Secondary | ICD-10-CM | POA: Diagnosis not present

## 2023-05-02 DIAGNOSIS — F411 Generalized anxiety disorder: Secondary | ICD-10-CM | POA: Diagnosis not present

## 2023-05-17 DIAGNOSIS — F411 Generalized anxiety disorder: Secondary | ICD-10-CM | POA: Diagnosis not present

## 2023-05-22 ENCOUNTER — Encounter: Payer: Self-pay | Admitting: Internal Medicine

## 2023-05-22 ENCOUNTER — Ambulatory Visit (INDEPENDENT_AMBULATORY_CARE_PROVIDER_SITE_OTHER): Payer: BC Managed Care – PPO | Admitting: Internal Medicine

## 2023-05-22 ENCOUNTER — Ambulatory Visit (INDEPENDENT_AMBULATORY_CARE_PROVIDER_SITE_OTHER)

## 2023-05-22 VITALS — BP 124/80 | HR 89 | Temp 98.1°F | Resp 16 | Ht 64.0 in | Wt 153.0 lb

## 2023-05-22 DIAGNOSIS — H6505 Acute serous otitis media, recurrent, left ear: Secondary | ICD-10-CM | POA: Insufficient documentation

## 2023-05-22 DIAGNOSIS — Z0001 Encounter for general adult medical examination with abnormal findings: Secondary | ICD-10-CM | POA: Diagnosis not present

## 2023-05-22 DIAGNOSIS — R052 Subacute cough: Secondary | ICD-10-CM

## 2023-05-22 DIAGNOSIS — J0101 Acute recurrent maxillary sinusitis: Secondary | ICD-10-CM | POA: Diagnosis not present

## 2023-05-22 DIAGNOSIS — J45909 Unspecified asthma, uncomplicated: Secondary | ICD-10-CM | POA: Insufficient documentation

## 2023-05-22 DIAGNOSIS — F331 Major depressive disorder, recurrent, moderate: Secondary | ICD-10-CM

## 2023-05-22 DIAGNOSIS — J22 Unspecified acute lower respiratory infection: Secondary | ICD-10-CM | POA: Diagnosis not present

## 2023-05-22 DIAGNOSIS — L732 Hidradenitis suppurativa: Secondary | ICD-10-CM | POA: Diagnosis not present

## 2023-05-22 DIAGNOSIS — J4531 Mild persistent asthma with (acute) exacerbation: Secondary | ICD-10-CM

## 2023-05-22 DIAGNOSIS — R058 Other specified cough: Secondary | ICD-10-CM | POA: Diagnosis not present

## 2023-05-22 LAB — CBC WITH DIFFERENTIAL/PLATELET
Basophils Absolute: 0 10*3/uL (ref 0.0–0.1)
Basophils Relative: 0.5 % (ref 0.0–3.0)
Eosinophils Absolute: 0 10*3/uL (ref 0.0–0.7)
Eosinophils Relative: 0.5 % (ref 0.0–5.0)
HCT: 38.2 % (ref 36.0–46.0)
Hemoglobin: 13 g/dL (ref 12.0–15.0)
Lymphocytes Relative: 18.8 % (ref 12.0–46.0)
Lymphs Abs: 1.6 10*3/uL (ref 0.7–4.0)
MCHC: 33.9 g/dL (ref 30.0–36.0)
MCV: 84.9 fl (ref 78.0–100.0)
Monocytes Absolute: 0.5 10*3/uL (ref 0.1–1.0)
Monocytes Relative: 5.3 % (ref 3.0–12.0)
Neutro Abs: 6.5 10*3/uL (ref 1.4–7.7)
Neutrophils Relative %: 74.9 % (ref 43.0–77.0)
Platelets: 246 10*3/uL (ref 150.0–400.0)
RBC: 4.5 Mil/uL (ref 3.87–5.11)
RDW: 13.1 % (ref 11.5–15.5)
WBC: 8.7 10*3/uL (ref 4.0–10.5)

## 2023-05-22 LAB — BASIC METABOLIC PANEL
BUN: 11 mg/dL (ref 6–23)
CO2: 26 meq/L (ref 19–32)
Calcium: 9.8 mg/dL (ref 8.4–10.5)
Chloride: 101 meq/L (ref 96–112)
Creatinine, Ser: 0.76 mg/dL (ref 0.40–1.20)
GFR: 99.77 mL/min (ref >60.00)
Glucose, Bld: 95 mg/dL (ref 70–99)
Potassium: 4.5 meq/L (ref 3.5–5.1)
Sodium: 135 meq/L (ref 135–145)

## 2023-05-22 MED ORDER — METHYLPREDNISOLONE 4 MG PO TBPK
ORAL_TABLET | ORAL | 0 refills | Status: AC
Start: 2023-05-22 — End: 2023-05-28

## 2023-05-22 MED ORDER — AMOXICILLIN-POT CLAVULANATE 875-125 MG PO TABS
1.0000 | ORAL_TABLET | Freq: Two times a day (BID) | ORAL | 0 refills | Status: AC
Start: 2023-05-22 — End: 2023-06-01

## 2023-05-22 MED ORDER — HYDROCODONE BIT-HOMATROP MBR 5-1.5 MG/5ML PO SOLN
5.0000 mL | Freq: Three times a day (TID) | ORAL | 0 refills | Status: DC | PRN
Start: 2023-05-22 — End: 2023-11-28

## 2023-05-22 NOTE — Patient Instructions (Signed)

## 2023-05-22 NOTE — Progress Notes (Signed)
 Subjective:  Patient ID: Kaylee Hunt, female    DOB: 10-Dec-1985  Age: 38 y.o. MRN: [id number]  CC: Annual Exam and Cough   HPI Kaylee Hunt presents for a CPX and f/up ----  Discussed the use of AI scribe software for clinical note transcription with the patient, who gave verbal consent to proceed.  History of Present Illness   Kaylee Hunt is a 38 year old female who presents with a persistent cough and respiratory symptoms.  She has been experiencing a severe cough that began in January, initially diagnosed as bronchitis. A chest x-ray at that time showed clear lungs, although they did not sound clear. The cough resolved but has since recurred, now accompanied by thick greenish-yellow mucus causing choking. The cough is intense, leading to rib pain that started about two days ago. No fevers, chills, or night sweats are present. She experiences shortness of breath and wheezing.  She was prescribed an inhaler but finds it intolerable due to side effects such as jitteriness and increased heart rate. She is not currently on antibiotics, having stopped doxycycline due to gastrointestinal side effects. She recalls being treated with steroids, antibiotics, and cough suppressants in January, but these were not effective. She is currently taking spironolactone and Concerta 18 mg. She also takes Xyzal to manage drainage, although it is not effective, and uses Zyqual nightly.  She reports a sensation of left ear pressure.  She mentions a history of back issues and is on an anti-inflammatory diet, which has resulted in weight loss.  She did not receive a flu shot last year and does not typically get flu vaccines. Her Pap smear was normal in December.       Outpatient Medications Prior to Visit  Medication Sig Dispense Refill   desogestrel-ethinyl estradiol (MIRCETTE) 0.15-0.02/0.01 MG (21/5) tablet Take 1 tablet by mouth at bedtime. 84 tablet 3   dexmethylphenidate (FOCALIN XR) 10 MG 24 hr  capsule Take 10 mg by mouth.     spironolactone (ALDACTONE) 50 MG tablet Take 2 tablets by mouth daily.     ibuprofen (ADVIL) 800 MG tablet Take 1 tablet (800 mg total) by mouth every 8 (eight) hours as needed. 30 tablet 0   doxycycline (VIBRAMYCIN) 100 MG capsule 100 mg. (Patient not taking: Reported on 05/22/2023)     No facility-administered medications prior to visit.    ROS Review of Systems  Constitutional: Negative.  Negative for chills, diaphoresis, fatigue and fever.  HENT:  Positive for ear pain, postnasal drip, rhinorrhea, sinus pressure and sinus pain. Negative for hearing loss, nosebleeds, sore throat and trouble swallowing.   Respiratory:  Positive for cough, shortness of breath and wheezing. Negative for choking, chest tightness and stridor.   Cardiovascular:  Negative for chest pain, palpitations and leg swelling.  Gastrointestinal:  Negative for abdominal pain, constipation, diarrhea, nausea and vomiting.  Endocrine: Negative.   Genitourinary: Negative.  Negative for difficulty urinating.  Musculoskeletal: Negative.  Negative for arthralgias.  Skin: Negative.  Negative for color change.  Allergic/Immunologic: Negative.   Neurological: Negative.  Negative for dizziness and weakness.  Hematological:  Negative for adenopathy. Does not bruise/bleed easily.  Psychiatric/Behavioral: Negative.      Objective:  BP 124/80 (BP Location: Left Arm, Patient Position: Sitting, Cuff Size: Normal)   Pulse 89   Temp 98.1 F (36.7 C) (Oral)   Resp 16   Ht 5\' 4"  (1.626 m)   Wt 153 lb (69.4 kg)   LMP 05/08/2023 (Exact Date)  SpO2 99%   BMI 26.26 kg/m   BP Readings from Last 3 Encounters:  05/22/23 124/80  02/13/23 126/74  10/08/22 (!) 149/99    Wt Readings from Last 3 Encounters:  05/22/23 153 lb (69.4 kg)  02/13/23 170 lb (77.1 kg)  08/29/22 186 lb 11.2 oz (84.7 kg)    Physical Exam Vitals reviewed.  Constitutional:      General: She is not in acute distress.     Appearance: She is not ill-appearing, toxic-appearing or diaphoretic.  HENT:     Right Ear: Hearing, tympanic membrane and external ear normal. There is impacted cerumen.     Left Ear: Hearing, ear canal and external ear normal. A middle ear effusion is present. There is no impacted cerumen. Tympanic membrane is retracted. Tympanic membrane is not erythematous.     Nose: Nose normal.     Mouth/Throat:     Mouth: Mucous membranes are moist.  Eyes:     General: No scleral icterus.    Conjunctiva/sclera: Conjunctivae normal.  Cardiovascular:     Rate and Rhythm: Normal rate and regular rhythm.     Heart sounds: No murmur heard.    No friction rub. No gallop.  Pulmonary:     Effort: Pulmonary effort is normal. No respiratory distress.     Breath sounds: No stridor. Examination of the right-lower field reveals rhonchi. Rhonchi present. No decreased breath sounds, wheezing or rales.  Chest:     Chest wall: No tenderness.  Abdominal:     General: Abdomen is flat.     Palpations: There is no mass.     Tenderness: There is no abdominal tenderness. There is no guarding.     Hernia: No hernia is present.  Musculoskeletal:        General: Normal range of motion.     Cervical back: Neck supple.     Right lower leg: No edema.     Left lower leg: No edema.  Skin:    General: Skin is warm and dry.     Coloration: Skin is not pale.     Findings: No rash.  Neurological:     General: No focal deficit present.     Mental Status: She is alert. Mental status is at baseline.  Psychiatric:        Mood and Affect: Mood normal.        Behavior: Behavior normal.     Lab Results  Component Value Date   WBC 8.7 05/22/2023   HGB 13.0 05/22/2023   HCT 38.2 05/22/2023   PLT 246.0 05/22/2023   GLUCOSE 95 05/22/2023   CHOL 118 05/13/2020   TRIG 40.0 05/13/2020   HDL 60.80 05/13/2020   LDLCALC 49 05/13/2020   ALT 11 07/13/2022   AST 15 07/13/2022   NA 135 05/22/2023   K 4.5 05/22/2023   CL 101  05/22/2023   CREATININE 0.76 05/22/2023   BUN 11 05/22/2023   CO2 26 05/22/2023   TSH 0.60 07/13/2022    DG Chest 2 View Result Date: 05/22/2023 CLINICAL DATA:  Productive cough EXAM: CHEST - 2 VIEW COMPARISON:  03/14/2012 FINDINGS: The heart size and mediastinal contours are within normal limits. Both lungs are clear. The visualized skeletal structures are unremarkable. IMPRESSION: Normal chest radiographs. Electronically Signed   By: Duanne Guess D.O.   On: 05/22/2023 11:57     Assessment & Plan:   Subacute cough- CXR is normal. -     DG Chest 2 View; Future  Encounter for general adult medical examination with abnormal findings- Exam completed, labs reviewed, vaccines reviewed, cancer screenings are UTD, pt ed material was given.   Recurrent acute serous otitis media of left ear -     Amoxicillin-Pot Clavulanate; Take 1 tablet by mouth 2 (two) times daily for 10 days.  Dispense: 20 tablet; Refill: 0  Acute recurrent maxillary sinusitis -     Amoxicillin-Pot Clavulanate; Take 1 tablet by mouth 2 (two) times daily for 10 days.  Dispense: 20 tablet; Refill: 0  Moderate episode of recurrent major depressive disorder (HCC) -     Basic metabolic panel; Future -     CBC with Differential/Platelet; Future  LRTI (lower respiratory tract infection) -     Amoxicillin-Pot Clavulanate; Take 1 tablet by mouth 2 (two) times daily for 10 days.  Dispense: 20 tablet; Refill: 0 -     HYDROcodone Bit-Homatrop MBr; Take 5 mLs by mouth every 8 (eight) hours as needed for cough.  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  Sanda Linger, MD

## 2023-05-29 DIAGNOSIS — F411 Generalized anxiety disorder: Secondary | ICD-10-CM | POA: Diagnosis not present

## 2023-05-31 DIAGNOSIS — F411 Generalized anxiety disorder: Secondary | ICD-10-CM | POA: Diagnosis not present

## 2023-06-14 DIAGNOSIS — F411 Generalized anxiety disorder: Secondary | ICD-10-CM | POA: Diagnosis not present

## 2023-06-26 DIAGNOSIS — F411 Generalized anxiety disorder: Secondary | ICD-10-CM | POA: Diagnosis not present

## 2023-07-22 ENCOUNTER — Other Ambulatory Visit: Payer: Self-pay | Admitting: Obstetrics and Gynecology

## 2023-07-22 DIAGNOSIS — Z30011 Encounter for initial prescription of contraceptive pills: Secondary | ICD-10-CM

## 2023-07-24 DIAGNOSIS — F411 Generalized anxiety disorder: Secondary | ICD-10-CM | POA: Diagnosis not present

## 2023-07-25 ENCOUNTER — Other Ambulatory Visit: Payer: Self-pay | Admitting: Nurse Practitioner

## 2023-07-25 ENCOUNTER — Encounter: Payer: Self-pay | Admitting: Nurse Practitioner

## 2023-07-25 ENCOUNTER — Other Ambulatory Visit: Payer: Self-pay

## 2023-07-25 DIAGNOSIS — F411 Generalized anxiety disorder: Secondary | ICD-10-CM | POA: Diagnosis not present

## 2023-08-08 DIAGNOSIS — F411 Generalized anxiety disorder: Secondary | ICD-10-CM | POA: Diagnosis not present

## 2023-08-23 DIAGNOSIS — F411 Generalized anxiety disorder: Secondary | ICD-10-CM | POA: Diagnosis not present

## 2023-09-05 DIAGNOSIS — F411 Generalized anxiety disorder: Secondary | ICD-10-CM | POA: Diagnosis not present

## 2023-09-19 DIAGNOSIS — F411 Generalized anxiety disorder: Secondary | ICD-10-CM | POA: Diagnosis not present

## 2023-09-20 DIAGNOSIS — F411 Generalized anxiety disorder: Secondary | ICD-10-CM | POA: Diagnosis not present
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
## 2023-10-17 DIAGNOSIS — F331 Major depressive disorder, recurrent, moderate: Secondary | ICD-10-CM | POA: Diagnosis not present

## 2023-10-17 DIAGNOSIS — F1010 Alcohol abuse, uncomplicated: Secondary | ICD-10-CM | POA: Diagnosis not present

## 2023-10-17 DIAGNOSIS — F909 Attention-deficit hyperactivity disorder, unspecified type: Secondary | ICD-10-CM | POA: Diagnosis not present

## 2023-10-17 DIAGNOSIS — F411 Generalized anxiety disorder: Secondary | ICD-10-CM | POA: Diagnosis not present

## 2023-10-18 DIAGNOSIS — F1010 Alcohol abuse, uncomplicated: Secondary | ICD-10-CM | POA: Diagnosis not present

## 2023-10-18 DIAGNOSIS — F909 Attention-deficit hyperactivity disorder, unspecified type: Secondary | ICD-10-CM | POA: Diagnosis not present

## 2023-10-18 DIAGNOSIS — F331 Major depressive disorder, recurrent, moderate: Secondary | ICD-10-CM | POA: Diagnosis not present

## 2023-10-18 DIAGNOSIS — F411 Generalized anxiety disorder: Secondary | ICD-10-CM | POA: Diagnosis not present

## 2023-11-21 DIAGNOSIS — F321 Major depressive disorder, single episode, moderate: Secondary | ICD-10-CM | POA: Diagnosis not present

## 2023-11-21 DIAGNOSIS — F419 Anxiety disorder, unspecified: Secondary | ICD-10-CM | POA: Diagnosis not present

## 2023-11-28 ENCOUNTER — Ambulatory Visit (INDEPENDENT_AMBULATORY_CARE_PROVIDER_SITE_OTHER): Admitting: Nurse Practitioner

## 2023-11-28 ENCOUNTER — Encounter: Payer: Self-pay | Admitting: Nurse Practitioner

## 2023-11-28 VITALS — BP 110/70 | HR 68 | Resp 16

## 2023-11-28 DIAGNOSIS — Z113 Encounter for screening for infections with a predominantly sexual mode of transmission: Secondary | ICD-10-CM | POA: Diagnosis not present

## 2023-11-28 DIAGNOSIS — F411 Generalized anxiety disorder: Secondary | ICD-10-CM | POA: Diagnosis not present

## 2023-11-28 NOTE — Progress Notes (Signed)
   Acute Office Visit  Subjective:    Patient ID: Kaylee Hunt, female    DOB: 1985/04/20, 38 y.o.   MRN: [id number]   HPI 38 y.o. presents today for STD screening. Found out a couple of weeks ago husband was unfaithful. No symptoms or known exposure. Wants to schedule consult for tubal ligation.   Patient's last menstrual period was 11/27/2023 (exact date). Period Duration (Days): 3-4 Period Pattern: Regular Menstrual Flow: Moderate Menstrual Control: Maxi pad Dysmenorrhea: (!) Moderate Dysmenorrhea Symptoms: Cramping, Nausea, Diarrhea, Headache  Review of Systems  Constitutional: Negative.   Genitourinary: Negative.        Objective:    Physical Exam Constitutional:      Appearance: Normal appearance.  Genitourinary:    General: Normal vulva.     Vagina: Bleeding present.     Cervix: Normal.     BP 110/70   Pulse 68   Resp 16   LMP 11/27/2023 (Exact Date)  Wt Readings from Last 3 Encounters:  05/22/23 153 lb (69.4 kg)  02/13/23 170 lb (77.1 kg)  08/29/22 186 lb 11.2 oz (84.7 kg)        Kaylee Hunt, CMA present as Biomedical engineer.   Assessment & Plan:   Problem List Items Addressed This Visit   None Visit Diagnoses       Screen for STD (sexually transmitted disease)    -  Primary   Relevant Orders   SURESWAB CT/NG/T. vaginalis   RPR   HIV Antibody (routine testing w rflx)   Hepatitis C antibody      Plan: STD panel pending. Will schedule surgical consult with Dr. Glennon for BTL.   Return if symptoms worsen or fail to improve.    Kaylee DELENA Shutter DNP, 12:09 PM 11/28/2023

## 2023-11-29 LAB — HIV ANTIBODY (ROUTINE TESTING W REFLEX)
HIV 1&2 Ab, 4th Generation: NONREACTIVE
HIV FINAL INTERPRETATION: NEGATIVE

## 2023-11-29 LAB — SURESWAB CT/NG/T. VAGINALIS
C. trachomatis RNA, TMA: NOT DETECTED
N. gonorrhoeae RNA, TMA: NOT DETECTED
Trichomonas vaginalis RNA: NOT DETECTED

## 2023-11-29 LAB — RPR: RPR Ser Ql: NONREACTIVE

## 2023-11-29 LAB — HEPATITIS C ANTIBODY: Hepatitis C Ab: NONREACTIVE

## 2023-12-02 ENCOUNTER — Ambulatory Visit: Payer: Self-pay | Admitting: Nurse Practitioner

## 2023-12-06 DIAGNOSIS — I10 Essential (primary) hypertension: Secondary | ICD-10-CM | POA: Insufficient documentation

## 2023-12-13 DIAGNOSIS — F419 Anxiety disorder, unspecified: Secondary | ICD-10-CM | POA: Diagnosis not present

## 2023-12-13 DIAGNOSIS — F321 Major depressive disorder, single episode, moderate: Secondary | ICD-10-CM | POA: Diagnosis not present

## 2023-12-19 ENCOUNTER — Encounter: Payer: Self-pay | Admitting: Obstetrics and Gynecology

## 2023-12-19 ENCOUNTER — Ambulatory Visit (INDEPENDENT_AMBULATORY_CARE_PROVIDER_SITE_OTHER): Admitting: Obstetrics and Gynecology

## 2023-12-19 VITALS — BP 124/76 | HR 106

## 2023-12-19 DIAGNOSIS — Z3009 Encounter for other general counseling and advice on contraception: Secondary | ICD-10-CM

## 2023-12-19 DIAGNOSIS — Z803 Family history of malignant neoplasm of breast: Secondary | ICD-10-CM

## 2023-12-19 NOTE — Progress Notes (Unsigned)
 38 y.o. y.o. female here for surgical consult for operative laparoscopy with bilateral salpingectomy. Patient desires permanent sterilization.  Patient's last menstrual period was 11/27/2023 (exact date).   H/o recent D&C for miscarriage Had PreE and IUGR with prior pregnancies. 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 Yearly pap smears Gardesil: completed MMG: goes yearly. Has had breast biopsy on right and lump on left followed q 6 months. Now q year. Mother with stage 4 breast cancer at the age of 66. Mother passes shortly after diagnosis. Referral to genetic counselor placed. Testing through them Colon: none. No family history of colon cancer  There is no height or weight on file to calculate BMI.    Blood pressure 124/76, pulse (!) 106, last menstrual period 11/27/2023, SpO2 98%.     Component Value Date/Time   DIAGPAP  02/13/2023 1410    - Negative for intraepithelial lesion or malignancy (NILM)   DIAGPAP  02/16/2022 1408    - Negative for Intraepithelial Lesions or Malignancy (NILM)   DIAGPAP - Benign reactive/reparative changes 02/16/2022 1408   HPVHIGH Negative 02/13/2023 1410   HPVHIGH Negative 02/16/2022 1408   HPVHIGH Negative 02/02/2021 1642   ADEQPAP  02/13/2023 1410    Satisfactory for evaluation; transformation zone component PRESENT.   ADEQPAP  02/16/2022 1408    Satisfactory for evaluation; transformation zone component PRESENT.   ADEQPAP  02/02/2021 1642    Satisfactory for evaluation; transformation zone component PRESENT.    GYN HISTORY:    Component Value Date/Time   DIAGPAP  02/13/2023 1410    - Negative for intraepithelial lesion or malignancy (NILM)   DIAGPAP  02/16/2022 1408    - Negative for Intraepithelial Lesions or Malignancy (NILM)   DIAGPAP - Benign reactive/reparative changes 02/16/2022 1408   HPVHIGH Negative  02/13/2023 1410   HPVHIGH Negative 02/16/2022 1408   HPVHIGH Negative 02/02/2021 1642   ADEQPAP  02/13/2023 1410    Satisfactory for evaluation; transformation zone component PRESENT.   ADEQPAP  02/16/2022 1408    Satisfactory for evaluation; transformation zone component PRESENT.   ADEQPAP  02/02/2021 1642    Satisfactory for evaluation; transformation zone component PRESENT.    OB History  Gravida Para Term Preterm AB Living  5 2 1 1 2 2   SAB IAB Ectopic Multiple Live Births  2   0 2    # Outcome Date GA Lbr Len/2nd Weight Sex Type Anes PTL Lv  5 Gravida           4 Term 03/11/15 [redacted]w[redacted]d / 01:20 6 lb 13 oz (3.09 kg) M Vag-Spont None N LIV  3 Preterm 08/19/08 [redacted]w[redacted]d  3 lb (1.361 kg) F CS-LTranv  N LIV     Complications: Preeclampsia, HELLP syndrome  2 SAB 10/12/07          1 SAB 10/12/06            Past Medical History:  Diagnosis Date  . Abnormal Pap smear of cervix   . Anxiety   . Depression   . GERD (gastroesophageal reflux disease)   . History of poor fetal growth 09/10/2014  . History of premature delivery 08/19/2014   31 4/7 weeks due to HELLP and IUGR, breech  . Hx of preeclampsia, prior pregnancy, currently pregnant 09/10/2014    Past Surgical History:  Procedure Laterality Date  . CESAREAN SECTION    .  COLPOSCOPY    . DILATION AND EVACUATION N/A 08/14/2022   Procedure: DILATATION AND EVACUATION (D&E);  Surgeon: Janit Alm Agent, MD;  Location: [facility] ORS;  Service: Gynecology;  Laterality: N/A;  . LEEP  08/04/2020   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  . WISDOM TOOTH EXTRACTION     four; age 58   OB History     Gravida  5   Para  2   Term  1   Preterm  1   AB  2   Living  2      SAB  2   IAB      Ectopic      Multiple  0   Live Births  2           Current Outpatient Medications on File Prior to Visit  Medication Sig Dispense Refill  . COTEMPLA XR-ODT 8.6 MG TBED Take 1 tablet by mouth every morning.    .  pregabalin (LYRICA) 75 MG capsule Take 75 mg by mouth 2 (two) times daily.     No current facility-administered medications on file prior to visit.    Social History   Socioeconomic History  . Marital status: Married    Spouse name: Rossana Molchan  . Number of children: 2  . Years of education: 60  . Highest education level: Some college, no degree  Occupational History  . Occupation: Land  Tobacco Use  . Smoking status: Former    Current packs/day: 0.00    Types: Cigarettes    Quit date: 09/09/2008    Years since quitting: 15.2  . Smokeless tobacco: Never  Vaping Use  . Vaping status: Former  . Substances: Nicotine, CBD, Flavoring  Substance and Sexual Activity  . Alcohol use: Yes    Comment: occ  . 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 Sexual activity: Yes    Partners: Male    Birth control/protection: None  Other Topics Concern  . Not on file  Social History Narrative  . Not on file   Social Drivers of Health   Financial Resource Strain: Low Risk  (05/21/2023)   Overall Financial Resource Strain (CARDIA)   . Difficulty of Paying Living Expenses: Not hard at all  Food Insecurity: No Food Insecurity (05/21/2023)   Hunger Vital Sign   . Worried About Programme researcher, broadcasting/film/video in the Last Year: Never true   . Ran Out of Food in the Last Year: Never true  Transportation Needs: No Transportation Needs (05/21/2023)   PRAPARE - Transportation   . Lack of Transportation (Medical): No   . Lack of Transportation (Non-Medical): No  Physical Activity: Unknown (05/21/2023)   Exercise Vital Sign   . Days of Exercise per Week: Patient declined   . Minutes of Exercise per Session: Not on file  Stress: Stress Concern Present (05/21/2023)   [employer] of Occupational Health - Occupational Stress Questionnaire   . Feeling of Stress : To some extent  Social Connections: Socially Isolated (05/21/2023)   Social Connection and Isolation  Panel   . Frequency of Communication with Friends and Family: Never   . Frequency of Social Gatherings with Friends and Family: Never   . Attends Religious Services: Never   . Active Member of Clubs or Organizations: No   . Attends Banker Meetings: Not on file   . Marital Status: Married  Catering manager Violence: Not At  Risk (06/05/2022)   Humiliation, Afraid, Rape, and Kick questionnaire   . Fear of Current or Ex-Partner: No   . Emotionally Abused: No   . Physically Abused: No   . Sexually Abused: No    Family History  Problem Relation Age of Onset  . Depression Mother   . Breast cancer Mother 53  . Cancer Father        Skin  . Post-traumatic stress disorder Father   . Depression Father   . Anxiety disorder Father   . Depression Brother   . Heart murmur Brother   . Alzheimer's disease Maternal Grandmother   . Alcohol abuse Maternal Grandfather   . Cancer Maternal Grandfather        ? type  . Alzheimer's disease Paternal Grandmother   . Dementia Paternal Grandmother   . Heart attack Paternal Grandfather      Allergies  Allergen Reactions  . Meloxicam Other (See Comments)      Patient's last menstrual period was Patient's last menstrual period was 11/27/2023 (exact date)..          Sexually active: ***  Exercising: ***   Review of Systems Alls systems reviewed and are negative.     OBGyn Exam    A:         Well Woman GYN exam                             P:        Pap smear {Pap indication:31164} Encouraged annual mammogram screening Colon cancer screening {Colon cancer screening:31170} DXA {DXA screening:31171} Labs and immunizations {annual labs:31172} Discussed breast self exams Encouraged healthy lifestyle practices Encouraged Vit D and Calcium   No follow-ups on file.  Kaylee Hunt

## 2023-12-26 DIAGNOSIS — F321 Major depressive disorder, single episode, moderate: Secondary | ICD-10-CM | POA: Diagnosis not present

## 2023-12-26 DIAGNOSIS — F419 Anxiety disorder, unspecified: Secondary | ICD-10-CM | POA: Diagnosis not present

## 2023-12-27 DIAGNOSIS — F411 Generalized anxiety disorder: Secondary | ICD-10-CM | POA: Diagnosis not present

## 2024-01-10 ENCOUNTER — Encounter: Payer: Self-pay | Admitting: Internal Medicine

## 2024-01-10 ENCOUNTER — Ambulatory Visit: Admitting: Internal Medicine

## 2024-01-10 VITALS — BP 130/82 | HR 75 | Temp 98.0°F | Resp 16 | Ht 64.0 in | Wt 139.6 lb

## 2024-01-10 DIAGNOSIS — R202 Paresthesia of skin: Secondary | ICD-10-CM | POA: Insufficient documentation

## 2024-01-10 DIAGNOSIS — M47817 Spondylosis without myelopathy or radiculopathy, lumbosacral region: Secondary | ICD-10-CM | POA: Insufficient documentation

## 2024-01-10 DIAGNOSIS — M51362 Other intervertebral disc degeneration, lumbar region with discogenic back pain and lower extremity pain: Secondary | ICD-10-CM | POA: Diagnosis not present

## 2024-01-10 DIAGNOSIS — F419 Anxiety disorder, unspecified: Secondary | ICD-10-CM | POA: Diagnosis not present

## 2024-01-10 DIAGNOSIS — G894 Chronic pain syndrome: Secondary | ICD-10-CM | POA: Insufficient documentation

## 2024-01-10 DIAGNOSIS — L732 Hidradenitis suppurativa: Secondary | ICD-10-CM | POA: Diagnosis not present

## 2024-01-10 DIAGNOSIS — M109 Gout, unspecified: Secondary | ICD-10-CM | POA: Insufficient documentation

## 2024-01-10 DIAGNOSIS — F321 Major depressive disorder, single episode, moderate: Secondary | ICD-10-CM | POA: Diagnosis not present

## 2024-01-10 DIAGNOSIS — M51369 Other intervertebral disc degeneration, lumbar region without mention of lumbar back pain or lower extremity pain: Secondary | ICD-10-CM | POA: Insufficient documentation

## 2024-01-10 MED ORDER — CELECOXIB 100 MG PO CAPS: 100.0000 mg | ORAL_CAPSULE | Freq: Two times a day (BID) | ORAL | 1 refills | Status: DC

## 2024-01-10 MED ORDER — PREGABALIN 75 MG PO CAPS
75.0000 mg | ORAL_CAPSULE | Freq: Two times a day (BID) | ORAL | 1 refills | Status: AC
Start: 2024-01-10 — End: ?

## 2024-01-10 NOTE — Patient Instructions (Addendum)
 Managing Chronic Back Pain Chronic back pain is pain that lasts longer than 3 months. It often affects the lower back. It may feel like a muscle ache or a sharp, stabbing pain. It can be mild, moderate, or severe. There are things you can do to help manage your pain. See what works best for you. Your health care provider may also give you other instructions. What actions can I take to manage my chronic back pain? You may be given a treatment plan by your provider. Treatment often starts with rest and pain relief. It may also include: Physical therapy. These are exercises to help restore movement and strength to your back. Techniques to help you relax. Counseling or therapy. Cognitive behavioral therapy (CBT) is a form of therapy that helps you set goals and make changes. Acupuncture or massage therapy. Local electrical stimulation. Injections. You may be given medicines to numb an area or relieve pain. If other treatments do not help, you may need surgery. How to use body mechanics and posture to help with pain You can help relieve stress on your back with good posture and healthy body mechanics. Body mechanics are all the ways your body moves during the day. Posture is part of body mechanics. Good posture means: Your spine is in its correct S-curve, or neutral, position. Your shoulders are pulled back a bit. Your head is not tipped forward. To improve your posture and body mechanics, follow these guidelines. Standing  When standing, keep your feet about hip-width apart. Keep your knees slightly bent. Your ears, shoulders, and hips should line up. Your spine should be neutral. When you stand in one place for a long time, place one foot on a stable object that is 2-4 inches (5-10 cm) high, such as a footstool. Sitting  When sitting, keep your feet flat on the floor. Use a footrest, if needed. Keep your thighs parallel to the floor. Try not to round your shoulders or tilt your head  forward. When working at a desk or a computer: Position your desk so your hands are a little lower than your elbows. Slide your chair under your desk so you are close enough to have good posture. Position your monitor so you are looking straight ahead and do not have to tilt your head to view the screen. Lifting  Keep your feet shoulder-width apart. Tighten the muscles of your abdomen. Bend your knees and hips. Keep your spine neutral. Lift using the strength of your legs, not your back. Do not lock your knees straight out. Ask for help to lift heavy or awkward objects. Resting  Do not lie down in a way that causes pain. If you have pain when you sit, bend, stoop, or squat, lie in a way that your body does not bend much. Try not to curl up on your side with your arms and knees near your chest (fetal position). If it hurts to stand for a long time or reach with your arms, lie with your spine neutral and knees bent slightly. Try lying: On your side with a pillow between your knees. On your back with a pillow under your knees. How to recognize changes in your chronic back pain Let your provider know if your pain gets worse or does not get better with treatment. Your back pain may be getting worse if you have pain that: Starts to cause problems with your posture. Gets worse when you sit, stand, walk, bend, or lift things. Happens when you are active,  at rest, or both. Makes it hard for you to move around (limits mobility). Occurs with fever, weight loss, or trouble peeing (urinating). Causes numbness and tingling. Follow these instructions at home: Medicines You may need to take medicines for pain and inflammation. These may be taken by mouth or put on the skin. You may also be given muscle relaxants. Take over-the-counter and prescription medicines only as told by your provider. Ask your provider if the medicine prescribed to you: Requires you to avoid driving or using machinery. Can  cause constipation. You may need to take these actions to prevent or treat constipation: Drink enough fluid to keep your pee (urine) pale yellow. Take over-the-counter or prescription medicines. Eat foods that are high in fiber, such as beans, whole grains, and fresh fruits and vegetables. Limit foods that are high in fat and processed sugars, such as fried or sweet foods. Lifestyle Do not use any products that contain nicotine or tobacco. These products include cigarettes, chewing tobacco, and vaping devices, such as e-cigarettes. If you need help quitting, ask your provider. Eat a healthy diet. Eat lots of vegetables, fruits, fish, and lean meats. Work with your provider to stay at a healthy weight. General instructions Get regular exercise as told. Exercise can help with flexibility and strength. If physical therapy was prescribed, do exercises as told by your provider. Use ice or heat therapy as told by your provider. Where can I get support? Think about joining a support group for people with chronic back pain. You can find some groups at: Pain Connection Program: painconnection.org The American Chronic Pain Association: acpanow.com Contact a health care provider if: Your pain does not get better with rest or medicine. You have new pain. You have a fever. You lose weight quickly. You have trouble doing your normal activities. You feel weak or numb in one or both of your legs or feet. Get help right away if: You are not able to control when you pee or poop. You have severe back pain and: Nausea or vomiting. Pain in your chest or abdomen. Shortness of breath. You faint. These symptoms may be an emergency. Get help right away. Call 911. Do not wait to see if the symptoms will go away. Do not drive yourself to the hospital. This information is not intended to replace advice given to you by your health care provider. Make sure you discuss any questions you have with your health care  provider. Document Revised: 10/03/2021 Document Reviewed: 10/03/2021 Elsevier Patient Education  2024 [employer].
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
## 2024-01-11 ENCOUNTER — Encounter: Payer: Self-pay | Admitting: Internal Medicine

## 2024-01-14 ENCOUNTER — Ambulatory Visit: Admitting: Internal Medicine

## 2024-01-15 ENCOUNTER — Encounter: Payer: Self-pay | Admitting: Obstetrics and Gynecology

## 2024-01-15 ENCOUNTER — Ambulatory Visit: Admitting: Obstetrics and Gynecology

## 2024-01-15 VITALS — BP 110/60 | HR 86 | Ht 63.0 in | Wt 139.6 lb

## 2024-01-15 DIAGNOSIS — Z01818 Encounter for other preprocedural examination: Secondary | ICD-10-CM

## 2024-01-15 DIAGNOSIS — Z803 Family history of malignant neoplasm of breast: Secondary | ICD-10-CM | POA: Diagnosis not present

## 2024-01-15 DIAGNOSIS — Z3009 Encounter for other general counseling and advice on contraception: Secondary | ICD-10-CM

## 2024-01-15 MED ORDER — ACETAMINOPHEN 500 MG PO TABS: 500.0000 mg | ORAL_TABLET | Freq: Four times a day (QID) | ORAL | 0 refills | Status: AC | PRN

## 2024-01-15 MED ORDER — METOCLOPRAMIDE HCL 10 MG PO TABS: 10.0000 mg | ORAL_TABLET | Freq: Three times a day (TID) | ORAL | 0 refills | Status: DC | PRN

## 2024-01-15 MED ORDER — OXYCODONE HCL 5 MG PO TABS: 5.0000 mg | ORAL_TABLET | ORAL | 0 refills | Status: DC | PRN

## 2024-01-15 NOTE — Progress Notes (Signed)
 PREOP H&P operative laparoscopy with bilateral salpingectomy  38 y.o. y.o. female here for surgical consult for operative laparoscopy with bilateral salpingectomy. Patient desires permanent sterilization.  Patient's last menstrual period was 12/24/2023 (exact date).   H/o recent D&C for miscarriage Had PreE and IUGR with prior pregnancies. 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 Yearly pap smears Gardesil: completed MMG: goes yearly. Has had breast biopsy on right and lump on left followed q 6 months. Now q year. Mother with stage 4 breast cancer at the age of 82. Mother passes shortly after diagnosis. Referral to genetic counselor placed. Testing through them Colon: none. No family history of colon cancer  Body mass index is 24.73 kg/m.    Blood pressure 110/60, pulse 86, height 5' 3 (1.6 m), weight 139 lb 9.6 oz (63.3 kg), last menstrual period 12/24/2023, SpO2 100%.     Component Value Date/Time   DIAGPAP  02/13/2023 1410    - Negative for intraepithelial lesion or malignancy (NILM)   DIAGPAP  02/16/2022 1408    - Negative for Intraepithelial Lesions or Malignancy (NILM)   DIAGPAP - Benign reactive/reparative changes 02/16/2022 1408   HPVHIGH Negative 02/13/2023 1410   HPVHIGH Negative 02/16/2022 1408   HPVHIGH Negative 02/02/2021 1642   ADEQPAP  02/13/2023 1410    Satisfactory for evaluation; transformation zone component PRESENT.   ADEQPAP  02/16/2022 1408    Satisfactory for evaluation; transformation zone component PRESENT.   ADEQPAP  02/02/2021 1642    Satisfactory for evaluation; transformation zone component PRESENT.    GYN HISTORY:    Component Value Date/Time   DIAGPAP  02/13/2023 1410    - Negative for intraepithelial lesion or malignancy (NILM)   DIAGPAP  02/16/2022 1408    - Negative for Intraepithelial Lesions or Malignancy  (NILM)   DIAGPAP - Benign reactive/reparative changes 02/16/2022 1408   HPVHIGH Negative 02/13/2023 1410   HPVHIGH Negative 02/16/2022 1408   HPVHIGH Negative 02/02/2021 1642   ADEQPAP  02/13/2023 1410    Satisfactory for evaluation; transformation zone component PRESENT.   ADEQPAP  02/16/2022 1408    Satisfactory for evaluation; transformation zone component PRESENT.   ADEQPAP  02/02/2021 1642    Satisfactory for evaluation; transformation zone component PRESENT.    OB History  Gravida Para Term Preterm AB Living  5 2 1 1 2 2   SAB IAB Ectopic Multiple Live Births  2   0 2    # Outcome Date GA Lbr Len/2nd Weight Sex Type Anes PTL Lv  5 Gravida           4 Term 03/11/15 [redacted]w[redacted]d / 01:20 6 lb 13 oz (3.09 kg) M Vag-Spont None N LIV  3 Preterm 08/19/08 [redacted]w[redacted]d  3 lb (1.361 kg) F CS-LTranv  N LIV     Complications: Preeclampsia, HELLP syndrome  2 SAB 10/12/07          1 SAB 10/12/06            Past Medical History:  Diagnosis Date   Abnormal Pap smear of cervix    Anxiety    Depression    GERD (gastroesophageal reflux disease)    History of poor fetal growth 09/10/2014   History of premature delivery 08/19/2014   31 4/7 weeks due to HELLP and IUGR, breech   Hx of preeclampsia, prior pregnancy, currently pregnant 09/10/2014  Past Surgical History:  Procedure Laterality Date   CESAREAN SECTION     COLPOSCOPY     DILATION AND EVACUATION N/A 08/14/2022   Procedure: DILATATION AND EVACUATION (D&E);  Surgeon: Janit Alm Agent, MD;  Location: [facility] ORS;  Service: Gynecology;  Laterality: N/A;   LEEP  08/04/2020   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 180 capsule 1   COTEMPLA XR-ODT 8.6 MG TBED Take 1 tablet by mouth every morning.     pregabalin (LYRICA) 75 MG capsule Take 1 capsule (75 mg total) by mouth 2 (two) times daily. 180 capsule 1   No current facility-administered medications on file prior to visit.    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   Sexual activity: Not Currently    Partners: Male    Birth control/protection: None  Other Topics Concern   Not on file  Social History Narrative   Not on file   Social Drivers of Health   Financial Resource Strain: Low Risk  (01/08/2024)   Overall Financial Resource Strain (CARDIA)    Difficulty of Paying Living Expenses: Not hard at all  Food Insecurity: No Food Insecurity (01/08/2024)   Hunger Vital Sign    Worried About Running Out of Food in the Last Year: Never true    Ran Out of Food in the Last Year: Never true  Transportation Needs: No Transportation Needs (01/08/2024)   PRAPARE - Administrator, Civil Service (Medical): No    Lack of Transportation (Non-Medical): No  Physical Activity: Inactive (01/08/2024)   Exercise Vital Sign    Days of Exercise per Week: 0 days    Minutes of Exercise per Session: Not on file  Stress: Stress Concern Present (01/08/2024)   [employer] - Occupational Stress Questionnaire    Feeling of Stress: To some extent  Social Connections: Socially Isolated (01/08/2024)   Social Connection and Isolation Panel    Frequency of Communication with Friends and Family: Twice a week    Frequency of Social Gatherings with Friends  and Family: Never    Attends Religious Services: Never    Active Member of Clubs or Organizations: No    Attends Engineer, Structural: Not on file    Marital Status: Separated  Intimate Partner Violence: Not At Risk (06/05/2022)   Humiliation, Afraid, Rape, and Kick questionnaire    Fear of Current or Ex-Partner: No    Emotionally Abused: No    Physically Abused: No    Sexually Abused: No    Family History  Problem Relation Age of Onset   Depression Mother    Breast cancer Mother 6   Cancer Father        Skin   Post-traumatic stress disorder Father    Depression Father    Anxiety disorder Father    Depression Brother    Heart murmur Brother    Alzheimer's disease Maternal Grandmother    Alcohol abuse Maternal Grandfather    Cancer Maternal Grandfather        ? type   Alzheimer's disease Paternal Grandmother    Dementia Paternal Grandmother    Heart attack Paternal Grandfather      Allergies  Allergen Reactions   Meloxicam Other (See Comments)      Patient's last menstrual period was Patient's last menstrual period was 12/24/2023 (exact date)..            Review of Systems Alls systems reviewed and are negative.     OBGyn Exam    A:     Multiparity who desires permanent sterilization with bilateral salpingectomy surgical consult                             P:        She desires permanent sterilization. Discussed alternatives including LARC options and vasectomy. She declines these options. Discussed surgery of salpingectomy vs tubal ligation. She would like to do a salpingectomy.  Risks of surgery include but are not limited to: bleeding,  infection, injury to surrounding organs/tissues (i.e. bowel/bladder/ureters), need for additional procedures, wound complications, hospital re-admission, regret,  conversion to open surgery, and VTE. Discussed the salpingectomy is permanent and if changes her mind, she would have to do IVF, which is expensive and in most cases out of pocket cost.  She voiced understanding.  Reviewed restrictions and recovery following surgery Surgery scheduling PA sent. spent on reviewing records, imaging,  and one on one patient time and counseling patient and documentation Dr. Glennon  No follow-ups on file.  Almarie MARLA Glennon

## 2024-01-15 NOTE — Patient Instructions (Addendum)
 Surgery to Take Out One or Both Fallopian Tubes (Salpingectomy): What to Expect  Salpingectomy is surgery to remove one or both of the fallopian tubes. The fallopian tubes connect the ovaries to the uterus. You may need this surgery if: A procedure is being done on your belly or on the area between the hips (pelvis), and the health care provider thinks that removing the tubes will lower your risk for cancer. You have an ectopic pregnancy. This is when a fertilized egg attaches to the fallopian tube instead of the uterus. An ectopic pregnancy can cause the tube to burst or tear. You don't want to have children (sterilization). In this case, both tubes will be removed. You have cancer of the fallopian tube or nearby organs. You're at high risk for cancer of the ovaries. Your tube and ovary have become twisted. There are two methods that can be used to remove the fallopian tubes: An open method. One large cut is made in your belly. A laparoscopic method. Several small cuts are made in your belly. A thin, lighted camera (laparoscope) and other instruments are used to remove the fallopian tubes. The provider may also use a robot to help in surgery. Tell a health care provider about: Any allergies you have. All medicines you take. These include vitamins, herbs, eye drops, and creams. Any problems you or family members have had with anesthesia. Any bleeding problems you have. Any surgeries you've had. Any medical conditions you have. Whether you're pregnant, may be pregnant, or wish to get pregnant. What are the risks? Your provider will talk with you about risks. These may include: Infection. Bleeding. Allergic reactions to medicines. Blood clots in the legs or lungs. Damage to nearby structures or organs. What happens before the surgery? When to stop eating and drinking Eat and drink only as you've been told. You may be told this: 8 hours before your surgery Stop eating most foods. Do not  eat meat, fried foods, or fatty foods. Eat only light foods, such as toast or crackers. All liquids are OK except energy drinks and alcohol. 6 hours before your surgery Stop eating. Drink only clear liquids, such as water, clear fruit juice, black coffee, plain tea, and sports drinks. Do not drink energy drinks or alcohol. 2 hours before your surgery Stop drinking all liquids. You may be allowed to take medicines with small sips of water. If you do not eat and drink as told, your surgery may be delayed or canceled. Medicines Ask about changing or stopping: Any medicines you take. Any vitamins, herbs, or supplements you take. Do not take aspirin or ibuprofen  unless you're told to. Surgery safety For your safety, you may: Need to wash your skin with a soap that kills germs. Get antibiotics. Have your surgery site marked. Have hair removed at the surgery site. General instructions Ask if you'll be staying overnight in the hospital. If you'll be going home right after the surgery, plan to have a responsible adult: Drive you home from the hospital or clinic. You won't be allowed to drive. Stay with you for the time you're told. What happens during the surgery? An IV will be put into a vein in your hand or arm. You may be given: A sedative to help you relax. Anesthesia to keep you from feeling pain. A small, thin tube (catheter) may be inserted through your urethra and into your bladder. This will drain pee during your surgery. Depending on the type of surgery you're having, one cut  or several small cuts will be made in your belly. Your fallopian tube (or tubes) will be cut from the ovary and uterus and removed from your body. The cut or cuts in your belly will be closed with stitches, staples, skin glue, or tape strips. A bandage may be placed over your cut or cuts. These steps may vary. Ask what you can expect. What happens after the surgery?  You'll be watched closely until you  leave. This includes checking your pain level, blood pressure, heart rate, and breathing rate. You may have to wear compression stockings to reduce swelling and help prevent blood clots in your legs. You'll be given pain medicine as needed. This information is not intended to replace advice given to you by your health care provider. Make sure you discuss any questions you have with your health care provider. Document Revised: 09/25/2022 Document Reviewed: 09/25/2022 Elsevier Patient Education  2024 Else[employer] Inc.   Gas X will help with pain in chest No sex, tampons, swimming, hot tubs for 2 weeks Return with any heavy bleeding or fevers or bladder infection symptoms Keep incision open and dry.  You may wash them in the shower.  Remove the glue in 2 weeks after surgery, if still on. Return with any redness, warmth or pussy discharge from the incisions

## 2024-01-15 NOTE — H&P (View-Only) (Signed)
 PREOP H&P operative laparoscopy with bilateral salpingectomy  38 y.o. y.o. female here for surgical consult for operative laparoscopy with bilateral salpingectomy. Patient desires permanent sterilization.  Patient's last menstrual period was 12/24/2023 (exact date).   H/o recent D&C for miscarriage Had PreE and IUGR with prior pregnancies. 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 Yearly pap smears Gardesil: completed MMG: goes yearly. Has had breast biopsy on right and lump on left followed q 6 months. Now q year. Mother with stage 4 breast cancer at the age of 82. Mother passes shortly after diagnosis. Referral to genetic counselor placed. Testing through them Colon: none. No family history of colon cancer  Body mass index is 24.73 kg/m.    Blood pressure 110/60, pulse 86, height 5' 3 (1.6 m), weight 139 lb 9.6 oz (63.3 kg), last menstrual period 12/24/2023, SpO2 100%.     Component Value Date/Time   DIAGPAP  02/13/2023 1410    - Negative for intraepithelial lesion or malignancy (NILM)   DIAGPAP  02/16/2022 1408    - Negative for Intraepithelial Lesions or Malignancy (NILM)   DIAGPAP - Benign reactive/reparative changes 02/16/2022 1408   HPVHIGH Negative 02/13/2023 1410   HPVHIGH Negative 02/16/2022 1408   HPVHIGH Negative 02/02/2021 1642   ADEQPAP  02/13/2023 1410    Satisfactory for evaluation; transformation zone component PRESENT.   ADEQPAP  02/16/2022 1408    Satisfactory for evaluation; transformation zone component PRESENT.   ADEQPAP  02/02/2021 1642    Satisfactory for evaluation; transformation zone component PRESENT.    GYN HISTORY:    Component Value Date/Time   DIAGPAP  02/13/2023 1410    - Negative for intraepithelial lesion or malignancy (NILM)   DIAGPAP  02/16/2022 1408    - Negative for Intraepithelial Lesions or Malignancy  (NILM)   DIAGPAP - Benign reactive/reparative changes 02/16/2022 1408   HPVHIGH Negative 02/13/2023 1410   HPVHIGH Negative 02/16/2022 1408   HPVHIGH Negative 02/02/2021 1642   ADEQPAP  02/13/2023 1410    Satisfactory for evaluation; transformation zone component PRESENT.   ADEQPAP  02/16/2022 1408    Satisfactory for evaluation; transformation zone component PRESENT.   ADEQPAP  02/02/2021 1642    Satisfactory for evaluation; transformation zone component PRESENT.    OB History  Gravida Para Term Preterm AB Living  5 2 1 1 2 2   SAB IAB Ectopic Multiple Live Births  2   0 2    # Outcome Date GA Lbr Len/2nd Weight Sex Type Anes PTL Lv  5 Gravida           4 Term 03/11/15 [redacted]w[redacted]d / 01:20 6 lb 13 oz (3.09 kg) M Vag-Spont None N LIV  3 Preterm 08/19/08 [redacted]w[redacted]d  3 lb (1.361 kg) F CS-LTranv  N LIV     Complications: Preeclampsia, HELLP syndrome  2 SAB 10/12/07          1 SAB 10/12/06            Past Medical History:  Diagnosis Date   Abnormal Pap smear of cervix    Anxiety    Depression    GERD (gastroesophageal reflux disease)    History of poor fetal growth 09/10/2014   History of premature delivery 08/19/2014   31 4/7 weeks due to HELLP and IUGR, breech   Hx of preeclampsia, prior pregnancy, currently pregnant 09/10/2014  Past Surgical History:  Procedure Laterality Date   CESAREAN SECTION     COLPOSCOPY     DILATION AND EVACUATION N/A 08/14/2022   Procedure: DILATATION AND EVACUATION (D&E);  Surgeon: Janit Alm Agent, MD;  Location: [facility] ORS;  Service: Gynecology;  Laterality: N/A;   LEEP  08/04/2020   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 180 capsule 1   COTEMPLA XR-ODT 8.6 MG TBED Take 1 tablet by mouth every morning.     pregabalin (LYRICA) 75 MG capsule Take 1 capsule (75 mg total) by mouth 2 (two) times daily. 180 capsule 1   No current facility-administered medications on file prior to visit.    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   Sexual activity: Not Currently    Partners: Male    Birth control/protection: None  Other Topics Concern   Not on file  Social History Narrative   Not on file   Social Drivers of Health   Financial Resource Strain: Low Risk  (01/08/2024)   Overall Financial Resource Strain (CARDIA)    Difficulty of Paying Living Expenses: Not hard at all  Food Insecurity: No Food Insecurity (01/08/2024)   Hunger Vital Sign    Worried About Running Out of Food in the Last Year: Never true    Ran Out of Food in the Last Year: Never true  Transportation Needs: No Transportation Needs (01/08/2024)   PRAPARE - Administrator, Civil Service (Medical): No    Lack of Transportation (Non-Medical): No  Physical Activity: Inactive (01/08/2024)   Exercise Vital Sign    Days of Exercise per Week: 0 days    Minutes of Exercise per Session: Not on file  Stress: Stress Concern Present (01/08/2024)   [employer] - Occupational Stress Questionnaire    Feeling of Stress: To some extent  Social Connections: Socially Isolated (01/08/2024)   Social Connection and Isolation Panel    Frequency of Communication with Friends and Family: Twice a week    Frequency of Social Gatherings with Friends  and Family: Never    Attends Religious Services: Never    Active Member of Clubs or Organizations: No    Attends Engineer, Structural: Not on file    Marital Status: Separated  Intimate Partner Violence: Not At Risk (06/05/2022)   Humiliation, Afraid, Rape, and Kick questionnaire    Fear of Current or Ex-Partner: No    Emotionally Abused: No    Physically Abused: No    Sexually Abused: No    Family History  Problem Relation Age of Onset   Depression Mother    Breast cancer Mother 6   Cancer Father        Skin   Post-traumatic stress disorder Father    Depression Father    Anxiety disorder Father    Depression Brother    Heart murmur Brother    Alzheimer's disease Maternal Grandmother    Alcohol abuse Maternal Grandfather    Cancer Maternal Grandfather        ? type   Alzheimer's disease Paternal Grandmother    Dementia Paternal Grandmother    Heart attack Paternal Grandfather      Allergies  Allergen Reactions   Meloxicam Other (See Comments)      Patient's last menstrual period was Patient's last menstrual period was 12/24/2023 (exact date)..            Review of Systems Alls systems reviewed and are negative.     OBGyn Exam    A:     Multiparity who desires permanent sterilization with bilateral salpingectomy surgical consult                             P:        She desires permanent sterilization. Discussed alternatives including LARC options and vasectomy. She declines these options. Discussed surgery of salpingectomy vs tubal ligation. She would like to do a salpingectomy.  Risks of surgery include but are not limited to: bleeding,  infection, injury to surrounding organs/tissues (i.e. bowel/bladder/ureters), need for additional procedures, wound complications, hospital re-admission, regret,  conversion to open surgery, and VTE. Discussed the salpingectomy is permanent and if changes her mind, she would have to do IVF, which is expensive and in most cases out of pocket cost.  She voiced understanding.  Reviewed restrictions and recovery following surgery Surgery scheduling PA sent. spent on reviewing records, imaging,  and one on one patient time and counseling patient and documentation Dr. Glennon  No follow-ups on file.  Almarie MARLA Glennon

## 2024-01-16 ENCOUNTER — Ambulatory Visit
Admission: RE | Admit: 2024-01-16 | Discharge: 2024-01-16 | Disposition: A | Discharge status: Home / Self Care | Source: Ambulatory Visit | Attending: Obstetrics and Gynecology | Admitting: Obstetrics and Gynecology

## 2024-01-16 DIAGNOSIS — Z1231 Encounter for screening mammogram for malignant neoplasm of breast: Secondary | ICD-10-CM | POA: Diagnosis not present

## 2024-01-16 DIAGNOSIS — Z803 Family history of malignant neoplasm of breast: Secondary | ICD-10-CM

## 2024-01-17 ENCOUNTER — Other Ambulatory Visit: Payer: Self-pay | Admitting: Medical Genetics

## 2024-01-21 ENCOUNTER — Encounter: Payer: Self-pay | Admitting: *Deleted

## 2024-01-22 ENCOUNTER — Other Ambulatory Visit (HOSPITAL_COMMUNITY)
Admission: RE | Admit: 2024-01-22 | Discharge: 2024-01-22 | Disposition: A | Discharge status: Home / Self Care | Payer: Self-pay | Source: Ambulatory Visit | Attending: Medical Genetics | Admitting: Medical Genetics

## 2024-01-23 DIAGNOSIS — F411 Generalized anxiety disorder: Secondary | ICD-10-CM | POA: Diagnosis not present

## 2024-01-24 ENCOUNTER — Ambulatory Visit: Payer: Self-pay | Admitting: Obstetrics and Gynecology

## 2024-01-28 ENCOUNTER — Other Ambulatory Visit: Payer: Self-pay | Admitting: Obstetrics and Gynecology

## 2024-01-28 DIAGNOSIS — R928 Other abnormal and inconclusive findings on diagnostic imaging of breast: Secondary | ICD-10-CM

## 2024-01-30 ENCOUNTER — Encounter (HOSPITAL_COMMUNITY): Payer: Self-pay | Admitting: Obstetrics and Gynecology

## 2024-01-30 NOTE — Progress Notes (Signed)
 Spoke w/ via phone for pre-op interview--- Kaylee Hunt needs dos---- UPT per anesthesia. Surgeon orders requested 01/30/24.        Hunt results------ COVID test -----patient states asymptomatic no test needed Arrive at -------1400 NPO after MN NO Solid Food.  Clear liquids from MN until---1300 Pre-Surgery Ensure or G2:  Med rec completed Medications to take morning of surgery -----Lyrica  Diabetic medication -----  GLP1 agonist last dose: GLP1 instructions:  Patient instructed no nail polish to be worn day of surgery Patient instructed to bring photo id and insurance card day of surgery Patient aware to have Driver (ride ) / caregiver    for 24 hours after surgery - Father Kaylee Hunt Patient Special Instructions ----- Pre-Op special Instructions -----  Patient verbalized understanding of instructions that were given at this phone interview. Patient denies chest pain, sob, fever, cough at the interview.

## 2024-02-05 LAB — GENECONNECT MOLECULAR SCREEN: Genetic Analysis Overall Interpretation: NEGATIVE

## 2024-02-06 ENCOUNTER — Other Ambulatory Visit: Payer: Self-pay

## 2024-02-06 ENCOUNTER — Encounter (HOSPITAL_COMMUNITY): Payer: Self-pay | Admitting: Obstetrics and Gynecology

## 2024-02-06 ENCOUNTER — Ambulatory Visit (HOSPITAL_COMMUNITY): Payer: Self-pay | Admitting: Anesthesiology

## 2024-02-06 ENCOUNTER — Encounter (HOSPITAL_COMMUNITY)
Admission: RE | Disposition: A | Discharge status: Home / Self Care | Payer: Self-pay | Source: Home / Self Care | Attending: Obstetrics and Gynecology

## 2024-02-06 ENCOUNTER — Ambulatory Visit (HOSPITAL_COMMUNITY)
Admission: RE | Admit: 2024-02-06 | Discharge: 2024-02-06 | Disposition: A | Attending: Obstetrics and Gynecology | Admitting: Obstetrics and Gynecology

## 2024-02-06 DIAGNOSIS — J45909 Unspecified asthma, uncomplicated: Secondary | ICD-10-CM | POA: Diagnosis not present

## 2024-02-06 DIAGNOSIS — Z87891 Personal history of nicotine dependence: Secondary | ICD-10-CM | POA: Diagnosis not present

## 2024-02-06 DIAGNOSIS — N838 Other noninflammatory disorders of ovary, fallopian tube and broad ligament: Secondary | ICD-10-CM | POA: Diagnosis not present

## 2024-02-06 DIAGNOSIS — Z01818 Encounter for other preprocedural examination: Secondary | ICD-10-CM

## 2024-02-06 DIAGNOSIS — I10 Essential (primary) hypertension: Secondary | ICD-10-CM | POA: Diagnosis not present

## 2024-02-06 DIAGNOSIS — K219 Gastro-esophageal reflux disease without esophagitis: Secondary | ICD-10-CM | POA: Diagnosis not present

## 2024-02-06 DIAGNOSIS — Z302 Encounter for sterilization: Secondary | ICD-10-CM

## 2024-02-06 HISTORY — PX: LAPAROSCOPIC BILATERAL SALPINGECTOMY: SHX5889

## 2024-02-06 LAB — CBC
HCT: 43.3 % (ref 36.0–46.0)
Hemoglobin: 14.3 g/dL (ref 12.0–15.0)
MCH: 28.2 pg (ref 26.0–34.0)
MCHC: 33 g/dL (ref 30.0–36.0)
MCV: 85.4 fL (ref 80.0–100.0)
Platelets: 272 K/uL (ref 150–400)
RBC: 5.07 MIL/uL (ref 3.87–5.11)
RDW: 13.7 % (ref 11.5–15.5)
WBC: 9 K/uL (ref 4.0–10.5)
nRBC: 0 % (ref 0.0–0.2)

## 2024-02-06 LAB — TYPE AND SCREEN
ABO/RH(D): A POS
Antibody Screen: NEGATIVE

## 2024-02-06 LAB — POCT PREGNANCY, URINE: Preg Test, Ur: NEGATIVE

## 2024-02-06 SURGERY — SALPINGECTOMY, BILATERAL, LAPAROSCOPIC
Anesthesia: General | Site: Pelvis | Laterality: Bilateral

## 2024-02-06 MED ORDER — ORAL CARE MOUTH RINSE: 15.0000 mL | Freq: Once | OROMUCOSAL | Status: AC

## 2024-02-06 MED ORDER — FENTANYL CITRATE (PF) 100 MCG/2ML IJ SOLN
INTRAMUSCULAR | Status: AC
  Filled 2024-02-06: qty 2

## 2024-02-06 MED ORDER — POVIDONE-IODINE 10 % EX SWAB: 2.0000 | Freq: Once | CUTANEOUS | Status: DC

## 2024-02-06 MED ORDER — DEXAMETHASONE SOD PHOSPHATE PF 10 MG/ML IJ SOLN
INTRAMUSCULAR | Status: DC | PRN
  Administered 2024-02-06: 5 mg via INTRAVENOUS

## 2024-02-06 MED ORDER — ROCURONIUM BROMIDE 10 MG/ML (PF) SYRINGE
PREFILLED_SYRINGE | INTRAVENOUS | Status: DC | PRN
  Administered 2024-02-06: 40 mg via INTRAVENOUS

## 2024-02-06 MED ORDER — FENTANYL CITRATE (PF) 250 MCG/5ML IJ SOLN
INTRAMUSCULAR | Status: DC | PRN
  Administered 2024-02-06: 100 ug via INTRAVENOUS

## 2024-02-06 MED ORDER — LIDOCAINE 2% (20 MG/ML) 5 ML SYRINGE
INTRAMUSCULAR | Status: DC | PRN
  Administered 2024-02-06: 80 mg via INTRAVENOUS

## 2024-02-06 MED ORDER — PROPOFOL 10 MG/ML IV BOLUS
INTRAVENOUS | Status: AC
  Filled 2024-02-06: qty 20

## 2024-02-06 MED ORDER — CHLORHEXIDINE GLUCONATE 0.12 % MT SOLN
15.0000 mL | Freq: Once | OROMUCOSAL | Status: AC
  Administered 2024-02-06: 15 mL via OROMUCOSAL

## 2024-02-06 MED ORDER — SUGAMMADEX SODIUM 200 MG/2ML IV SOLN
INTRAVENOUS | Status: DC | PRN
  Administered 2024-02-06: 200 mg via INTRAVENOUS

## 2024-02-06 MED ORDER — MIDAZOLAM HCL (PF) 2 MG/2ML IJ SOLN
INTRAMUSCULAR | Status: DC | PRN
  Administered 2024-02-06: 2 mg via INTRAVENOUS

## 2024-02-06 MED ORDER — GABAPENTIN 300 MG PO CAPS
ORAL_CAPSULE | ORAL | Status: AC
  Filled 2024-02-06: qty 1

## 2024-02-06 MED ORDER — ONDANSETRON HCL 4 MG/2ML IJ SOLN
INTRAMUSCULAR | Status: AC
  Filled 2024-02-06: qty 2

## 2024-02-06 MED ORDER — ONDANSETRON HCL 4 MG/2ML IJ SOLN
INTRAMUSCULAR | Status: DC | PRN
  Administered 2024-02-06: 4 mg via INTRAVENOUS

## 2024-02-06 MED ORDER — MEPERIDINE HCL 25 MG/ML IJ SOLN: 6.2500 mg | INTRAMUSCULAR | Status: DC | PRN

## 2024-02-06 MED ORDER — CHLORHEXIDINE GLUCONATE 0.12 % MT SOLN
OROMUCOSAL | Status: AC
  Filled 2024-02-06: qty 15

## 2024-02-06 MED ORDER — GABAPENTIN 300 MG PO CAPS: 300.0000 mg | ORAL_CAPSULE | ORAL | Status: DC

## 2024-02-06 MED ORDER — ONDANSETRON HCL 4 MG/2ML IJ SOLN: 4.0000 mg | Freq: Once | INTRAMUSCULAR | Status: DC | PRN

## 2024-02-06 MED ORDER — LACTATED RINGERS IV SOLN: INTRAVENOUS | Status: DC

## 2024-02-06 MED ORDER — FENTANYL CITRATE (PF) 100 MCG/2ML IJ SOLN
25.0000 ug | INTRAMUSCULAR | Status: DC | PRN
  Administered 2024-02-06 (×3): 25 ug via INTRAVENOUS

## 2024-02-06 MED ORDER — ACETAMINOPHEN 500 MG PO TABS
1000.0000 mg | ORAL_TABLET | ORAL | Status: AC
  Administered 2024-02-06: 1000 mg via ORAL

## 2024-02-06 MED ORDER — SODIUM CHLORIDE 0.9 % IV SOLN
INTRAVENOUS | Status: AC
  Filled 2024-02-06: qty 2

## 2024-02-06 MED ORDER — ACETAMINOPHEN 500 MG PO TABS
ORAL_TABLET | ORAL | Status: AC
  Filled 2024-02-06: qty 2

## 2024-02-06 MED ORDER — PROPOFOL 10 MG/ML IV BOLUS
INTRAVENOUS | Status: DC | PRN
  Administered 2024-02-06: 200 mg via INTRAVENOUS

## 2024-02-06 MED ORDER — OXYCODONE HCL 5 MG/5ML PO SOLN: 5.0000 mg | Freq: Once | ORAL | Status: AC | PRN

## 2024-02-06 MED ORDER — 0.9 % SODIUM CHLORIDE (POUR BTL) OPTIME
TOPICAL | Status: DC | PRN
  Administered 2024-02-06: 1000 mL

## 2024-02-06 MED ORDER — MIDAZOLAM HCL 2 MG/2ML IJ SOLN
INTRAMUSCULAR | Status: AC
  Filled 2024-02-06: qty 2

## 2024-02-06 MED ORDER — SUGAMMADEX SODIUM 200 MG/2ML IV SOLN
INTRAVENOUS | Status: AC
  Filled 2024-02-06: qty 2

## 2024-02-06 MED ORDER — PHENYLEPHRINE 80 MCG/ML (10ML) SYRINGE FOR IV PUSH (FOR BLOOD PRESSURE SUPPORT)
PREFILLED_SYRINGE | INTRAVENOUS | Status: AC
  Filled 2024-02-06: qty 10

## 2024-02-06 MED ORDER — GLYCOPYRROLATE PF 0.2 MG/ML IJ SOSY
PREFILLED_SYRINGE | INTRAMUSCULAR | Status: AC
  Filled 2024-02-06: qty 1

## 2024-02-06 MED ORDER — LIDOCAINE 2% (20 MG/ML) 5 ML SYRINGE
INTRAMUSCULAR | Status: AC
  Filled 2024-02-06: qty 5

## 2024-02-06 MED ORDER — GLYCOPYRROLATE 0.2 MG/ML IJ SOLN
INTRAMUSCULAR | Status: DC | PRN
  Administered 2024-02-06: .2 mg via INTRAVENOUS

## 2024-02-06 MED ORDER — KETOROLAC TROMETHAMINE 15 MG/ML IJ SOLN
INTRAMUSCULAR | Status: DC | PRN
  Administered 2024-02-06: 15 mg via INTRAVENOUS

## 2024-02-06 MED ORDER — ROCURONIUM BROMIDE 10 MG/ML (PF) SYRINGE
PREFILLED_SYRINGE | INTRAVENOUS | Status: AC
  Filled 2024-02-06: qty 10

## 2024-02-06 MED ORDER — SODIUM CHLORIDE 0.9 % IV SOLN
2.0000 g | INTRAVENOUS | Status: AC
  Administered 2024-02-06: 2 g via INTRAVENOUS

## 2024-02-06 MED ORDER — OXYCODONE HCL 5 MG PO TABS
ORAL_TABLET | ORAL | Status: AC
  Filled 2024-02-06: qty 1

## 2024-02-06 MED ORDER — OXYCODONE HCL 5 MG PO TABS
5.0000 mg | ORAL_TABLET | Freq: Once | ORAL | Status: AC | PRN
  Administered 2024-02-06: 5 mg via ORAL

## 2024-02-06 MED ORDER — BUPIVACAINE HCL (PF) 0.5 % IJ SOLN
INTRAMUSCULAR | Status: DC | PRN
  Administered 2024-02-06: 20 mL

## 2024-02-06 SURGICAL SUPPLY — 23 items
CHLORAPREP W/TINT 26 (MISCELLANEOUS) ×1 IMPLANT
COVER MAYO STAND STRL (DRAPES) ×1 IMPLANT
DEFOGGER SCOPE WARM SEASHARP (MISCELLANEOUS) IMPLANT
DERMABOND ADVANCED .7 DNX12 (GAUZE/BANDAGES/DRESSINGS) IMPLANT
DRAPE SURG IRRIG POUCH 19X23 (DRAPES) ×1 IMPLANT
GLOVE BIOGEL PI IND STRL 7.0 (GLOVE) ×2 IMPLANT
GLOVE NEODERM STER SZ 7 (GLOVE) ×1 IMPLANT
GLOVE NEODERM STRL 7.5 LF PF (GLOVE) IMPLANT
GOWN STRL REUS W/ TWL LRG LVL3 (GOWN DISPOSABLE) ×1 IMPLANT
KIT PINK PAD W/HEAD ARM REST (MISCELLANEOUS) ×1 IMPLANT
KIT TURNOVER KIT B (KITS) ×1 IMPLANT
LIGASURE VESSEL 5MM BLUNT TIP (ELECTROSURGICAL) IMPLANT
PACK LAPAROSCOPY BASIN (CUSTOM PROCEDURE TRAY) ×1 IMPLANT
PAD OB MATERNITY 11 LF (PERSONAL CARE ITEMS) IMPLANT
SET TUBE SMOKE EVAC HIGH FLOW (TUBING) ×1 IMPLANT
SOL PREP POV-IOD 4OZ 10% (MISCELLANEOUS) IMPLANT
SOLN 0.9% NACL POUR BTL 1000ML (IV SOLUTION) ×1 IMPLANT
SUT MNCRL AB 4-0 PS2 18 (SUTURE) ×1 IMPLANT
TOWEL GREEN STERILE FF (TOWEL DISPOSABLE) ×1 IMPLANT
TRAY FOLEY W/BAG SLVR 14FR (SET/KITS/TRAYS/PACK) ×1 IMPLANT
TROCAR KII 8X100ML NONTHREADED (TROCAR) IMPLANT
TROCAR Z-THREAD OPTICAL 5X100M (TROCAR) ×3 IMPLANT
WARMER LAPAROSCOPE (MISCELLANEOUS) ×1 IMPLANT

## 2024-02-06 NOTE — Op Note (Signed)
 Preop Dx: multiparity who desires permanent sterilization Post op Dx: Same Surgeon: Dr. Almarie Carpen Circulator: [place], Hadassah Buel RAMAN, RN Relief Scrub: Neysa Joesph BROCKS Scrub Person: Rubin Hadassah HERO Circulator Assistant: Waddell Silvano HERO, RN RN First Assistant: Jackquline Judyann CROME, RN Complications: none  Findings 8cm uterus, normal ovaries and tubes, normal appendix, no endometriosis EBL: minimal UOP 300cc clear urine at the end of the procedure   PROCEDURE:  The patient was taken to the operating room where general anesthesia was obtained without difficulty.  She was then placed in the dorsal lithotomy position and prepared and draped in sterile fashion.  After an adequate timeout was performed, a bivalved speculum was then placed in the patient's vagina, and the anterior lip of cervix grasped with the single-tooth tenaculum.  The uterine manipulator was then advanced into the uterus.  The speculum was removed from the vagina.  Attention was then turned to the patient's abdomen where a 5-mm skin incision was made in the umbilical fold.  The 5-mm trocar and sleeve were then advanced without difficulty with the laparoscope under direct visualization into the abdomen.  The abdomen was then insufflated with carbon dioxide gas.  Adequate pneumoperitoneum was obtained.  A survey of the patient's pelvis and abdomen revealed the findings above.  Bilateral 5 and one 8-mm lower quadrant ports  were then placed under direct visualization.  The fallopian tubes were transected from the uterine attachments and the underlying mesosalpinx with the Ligasure device allowing for bilateral salpingectomy.  The fallopian tubes were then removed from the abdomen under direct visualization.  The operative site was surveyed, and it was found to be hemostatic.   No intraoperative injury to other surrounding organs was noted.  The abdomen was desufflated and all instruments were then removed from the patient's  abdomen.  All skin incisions were closed with 4-0 vicryl. They were also covered with dermabond.  The uterine manipulator was removed from the vagina without complications. The patient tolerated the procedure well.  Sponge, lap, and needle counts were correct times two.  The patient was then taken to the recovery room awake, extubated and in stable condition.  The patient will be discharged to home as per PACU criteria.  Routine postoperative instructions given.  She will follow up in the clinic in 2 weeks for postoperative evaluation.   Dr. Carpen

## 2024-02-06 NOTE — Anesthesia Postprocedure Evaluation (Signed)
 Anesthesia Post Note  Patient: Kaylee Hunt  Procedure(s) Performed: SALPINGECTOMY, BILATERAL, LAPAROSCOPIC (Bilateral: Pelvis)     Patient location during evaluation: PACU Anesthesia Type: General Level of consciousness: awake and alert Pain management: pain level controlled Vital Signs Assessment: post-procedure vital signs reviewed and stable Respiratory status: spontaneous breathing, nonlabored ventilation, respiratory function stable and patient connected to nasal cannula oxygen Cardiovascular status: blood pressure returned to baseline and stable Postop Assessment: no apparent nausea or vomiting Anesthetic complications: no   There were no known notable events for this encounter.  Last Vitals:  Vitals:   02/06/24 1530 02/06/24 1545  BP: 115/81 117/78  Pulse: 75 (!) 57  Resp: 14 16  Temp:  36.4 C  SpO2: 100% 94%    Last Pain:  Vitals:   02/06/24 1528  TempSrc:   PainSc: 3                  Rogan Wigley

## 2024-02-06 NOTE — Anesthesia Preprocedure Evaluation (Addendum)
 Anesthesia Evaluation  Patient identified by MRN, date of birth, ID band Patient awake    Reviewed: Allergy & Precautions, NPO status , Patient's Chart, lab work & pertinent test results  History of Anesthesia Complications Negative for: history of anesthetic complications  Airway Mallampati: II  TM Distance: >3 FB Neck ROM: full    Dental  (+) Dental Advidsory Given   Pulmonary neg shortness of breath, asthma , neg COPD, former smoker   Pulmonary exam normal        Cardiovascular hypertension, (-) angina Normal cardiovascular exam(-) dysrhythmias      Neuro/Psych  PSYCHIATRIC DISORDERS Anxiety Depression    negative neurological ROS     GI/Hepatic Neg liver ROS,GERD  ,,  Endo/Other  negative endocrine ROS    Renal/GU      Musculoskeletal   Abdominal   Peds  Hematology negative hematology ROS (+)   Anesthesia Other Findings   Reproductive/Obstetrics negative OB ROS                              Anesthesia Physical Anesthesia Plan  ASA: 2  Anesthesia Plan: General   Post-op Pain Management: Tylenol  PO (pre-op)* and Celebrex  PO (pre-op)*   Induction: Intravenous  PONV Risk Score and Plan: 3 and Ondansetron , Dexamethasone  and Midazolam   Airway Management Planned: Oral ETT  Additional Equipment: None  Intra-op Plan:   Post-operative Plan: Extubation in OR  Informed Consent: I have reviewed the patients History and Physical, chart, labs and discussed the procedure including the risks, benefits and alternatives for the proposed anesthesia with the patient or authorized representative who has indicated his/her understanding and acceptance.     Dental Advisory Given  Plan Discussed with: Anesthesiologist and CRNA  Anesthesia Plan Comments:         Anesthesia Quick Evaluation

## 2024-02-06 NOTE — Transfer of Care (Signed)
 Immediate Anesthesia Transfer of Care Note  Patient: Aala Ransom  Procedure(s) Performed: SALPINGECTOMY, BILATERAL, LAPAROSCOPIC (Bilateral: Pelvis)  Patient Location: PACU  Anesthesia Type:General  Level of Consciousness: awake, alert , and oriented  Airway & Oxygen Therapy: Patient Spontanous Breathing  Post-op Assessment: Report given to RN, Post -op Vital signs reviewed and stable, and Patient moving all extremities X 4  Post vital signs: Reviewed  Last Vitals:  Vitals Value Taken Time  BP 122/80 02/06/24 14:59  Temp    Pulse 96 02/06/24 15:00  Resp 20 02/06/24 15:00  SpO2 100 % 02/06/24 15:00  Vitals shown include unfiled device data.  Last Pain:  Vitals:   02/06/24 1315  TempSrc: Oral  PainSc: 0-No pain      Patients Stated Pain Goal: 3 (02/06/24 1315)  Complications: There were no known notable events for this encounter.

## 2024-02-06 NOTE — Anesthesia Procedure Notes (Signed)
 Procedure Name: Intubation Date/Time: 02/06/2024 2:04 PM  Performed by: Jerrye Herring, CRNAPre-anesthesia Checklist: Patient identified, Emergency Drugs available, Suction available and Patient being monitored Patient Re-evaluated:Patient Re-evaluated prior to induction Oxygen Delivery Method: Circle System Utilized Preoxygenation: Pre-oxygenation with 100% oxygen Induction Type: IV induction Ventilation: Mask ventilation without difficulty Laryngoscope Size: Mac and 3 Grade View: Grade I Tube type: Oral Tube size: 7.0 mm Number of attempts: 1 Airway Equipment and Method: Stylet and Oral airway Placement Confirmation: ETT inserted through vocal cords under direct vision, positive ETCO2 and breath sounds checked- equal and bilateral Secured at: 23 cm Tube secured with: Tape Dental Injury: Teeth and Oropharynx as per pre-operative assessment

## 2024-02-06 NOTE — Interval H&P Note (Signed)
 History and Physical Interval Note:  02/06/2024 1:34 PM  Kaylee Hunt  has presented today for surgery, with the diagnosis of encounter for sterilization.  The various methods of treatment have been discussed with the patient and family. After consideration of risks, benefits and other options for treatment, the patient has consented to  Procedure(s): SALPINGECTOMY, BILATERAL, LAPAROSCOPIC (Bilateral) as a surgical intervention.  The patient's history has been reviewed, patient examined, no change in status, stable for surgery.  I have reviewed the patient's chart and labs.  Questions were answered to the patient's satisfaction.     Almarie MARLA Carpen

## 2024-02-07 ENCOUNTER — Encounter (HOSPITAL_COMMUNITY): Payer: Self-pay | Admitting: Obstetrics and Gynecology

## 2024-02-08 ENCOUNTER — Ambulatory Visit: Payer: Self-pay | Admitting: Obstetrics and Gynecology

## 2024-02-08 LAB — SURGICAL PATHOLOGY

## 2024-02-14 ENCOUNTER — Ambulatory Visit: Payer: BC Managed Care – PPO | Admitting: Nurse Practitioner

## 2024-02-14 ENCOUNTER — Ambulatory Visit: Admitting: Nurse Practitioner

## 2024-02-19 ENCOUNTER — Other Ambulatory Visit

## 2024-02-22 ENCOUNTER — Encounter: Payer: Self-pay | Admitting: Obstetrics and Gynecology

## 2024-02-22 ENCOUNTER — Ambulatory Visit (INDEPENDENT_AMBULATORY_CARE_PROVIDER_SITE_OTHER): Payer: Self-pay | Admitting: Obstetrics and Gynecology

## 2024-02-22 VITALS — BP 124/80 | HR 90

## 2024-02-22 DIAGNOSIS — Z09 Encounter for follow-up examination after completed treatment for conditions other than malignant neoplasm: Secondary | ICD-10-CM

## 2024-02-22 NOTE — Progress Notes (Signed)
 Patient presents for 2 week postop from bilateral salpingectomy She is doing well. No fevers, VB, dysuria or severe abdominal pain.  BP 124/80 (BP Location: Left Arm, Patient Position: Sitting)   Pulse 90   SpO2 98%   Abdomen: incisions I/c/d, NT, ND  A/p PO 2 weeks doing well Resume all activities Rtc for annual exams  Dr. Glennon

## 2024-03-20 ENCOUNTER — Inpatient Hospital Stay

## 2024-03-20 ENCOUNTER — Inpatient Hospital Stay: Admitting: Genetic Counselor

## 2024-03-31 ENCOUNTER — Ambulatory Visit: Payer: Self-pay

## 2024-03-31 ENCOUNTER — Emergency Department: Admission: EM | Admit: 2024-03-31 | Discharge: 2024-03-31 | Disposition: A | Discharge status: Home / Self Care

## 2024-03-31 ENCOUNTER — Emergency Department

## 2024-03-31 ENCOUNTER — Inpatient Hospital Stay: Admission: RE | Admit: 2024-03-31

## 2024-03-31 ENCOUNTER — Other Ambulatory Visit: Payer: Self-pay

## 2024-03-31 ENCOUNTER — Encounter: Payer: Self-pay | Admitting: *Deleted

## 2024-03-31 DIAGNOSIS — S0033XA Contusion of nose, initial encounter: Secondary | ICD-10-CM | POA: Insufficient documentation

## 2024-03-31 DIAGNOSIS — Y9372 Activity, wrestling: Secondary | ICD-10-CM | POA: Insufficient documentation

## 2024-03-31 DIAGNOSIS — W228XXA Striking against or struck by other objects, initial encounter: Secondary | ICD-10-CM | POA: Insufficient documentation

## 2024-03-31 DIAGNOSIS — I10 Essential (primary) hypertension: Secondary | ICD-10-CM | POA: Insufficient documentation

## 2024-03-31 DIAGNOSIS — S0083XA Contusion of other part of head, initial encounter: Secondary | ICD-10-CM | POA: Insufficient documentation

## 2024-03-31 NOTE — Discharge Instructions (Signed)
 Your exam and CT scan are normal and reassuring with NO evidence of nasal bone fracture. Use OTC anti-inflammatory as needed.

## 2024-03-31 NOTE — Telephone Encounter (Signed)
 FYI Only or Action Required?: FYI only for provider: ED advised.  Patient was last seen in primary care on 01/10/2024 by Joshua Debby CROME, MD.  Called Nurse Triage reporting Facial Injury.  Symptoms began Friday night/Saturday morning.  Interventions attempted: Nothing.  Symptoms are: unchanged.  Triage Disposition: Go to ED Now (Notify PCP)  Patient/caregiver understands and will follow disposition?: yes                                       Summary: possible broken nose   Reason for Triage: possible broken nose     Reason for Disposition  Very deformed or crooked nose  Answer Assessment - Initial Assessment Questions 1. MECHANISM: How did the injury happen?      Face plant 2. ONSET: When did the injury happen? (e.g., minutes, hours ago)      Friday night Saturday morning 3. LOCATION: What part of the nose is injured?      Whole nose 4. APPEARANCE of INJURY: What does the nose look like?      Very swollen cannot breath out od nose 5. BLEEDING: Is the nose still bleeding? If Yes, ask: Is it difficult to stop?      Bleed a lot 6. SIZE: For cuts, bruises, or swelling, ask: How large is it? (e.g., inches or centimeters;  entire nose)      Whole nose 7. PAIN: Is it painful? If Yes, ask: How bad is the pain? (Scale 0-10; or none, mild, moderate, severe)     Mild modetate 8. TETANUS: For any breaks in the skin, ask: When was your last tetanus booster?     No - no breaks 9. OTHER SYMPTOMS: Do you have any other symptoms? (e.g., headache, neck pain, loss of consciousness)     Can't breathe through nose  Protocols used: Nose Injury-A-AH

## 2024-04-01 NOTE — Telephone Encounter (Signed)
 Patient was seen in the ED.

## 2024-04-08 ENCOUNTER — Other Ambulatory Visit

## 2024-05-06 ENCOUNTER — Ambulatory Visit: Payer: Self-pay | Admitting: Nurse Practitioner

## 2024-05-08 ENCOUNTER — Inpatient Hospital Stay: Admitting: Genetic Counselor

## 2024-05-08 ENCOUNTER — Inpatient Hospital Stay

## 2024-06-05 ENCOUNTER — Encounter: Admitting: Internal Medicine

## 2024-08-19 ENCOUNTER — Ambulatory Visit: Admitting: Physician Assistant
